# Patient Record
Sex: Male | Born: 1935 | Race: White | Hispanic: No | Marital: Married | Smoking: Former smoker
Health system: Southern US, Community
[De-identification: ages and names within clinical notes are randomized; demographics above are authoritative.]

## PROBLEM LIST (undated history)

## (undated) DIAGNOSIS — I4891 Unspecified atrial fibrillation: Secondary | ICD-10-CM

## (undated) DIAGNOSIS — I10 Essential (primary) hypertension: Secondary | ICD-10-CM

## (undated) DIAGNOSIS — H409 Unspecified glaucoma: Secondary | ICD-10-CM

## (undated) DIAGNOSIS — C9110 Chronic lymphocytic leukemia of B-cell type not having achieved remission: Secondary | ICD-10-CM

## (undated) DIAGNOSIS — I5030 Unspecified diastolic (congestive) heart failure: Secondary | ICD-10-CM

## (undated) DIAGNOSIS — D649 Anemia, unspecified: Secondary | ICD-10-CM

## (undated) DIAGNOSIS — N400 Enlarged prostate without lower urinary tract symptoms: Secondary | ICD-10-CM

## (undated) DIAGNOSIS — Z8619 Personal history of other infectious and parasitic diseases: Secondary | ICD-10-CM

## (undated) DIAGNOSIS — L299 Pruritus, unspecified: Secondary | ICD-10-CM

## (undated) DIAGNOSIS — K635 Polyp of colon: Secondary | ICD-10-CM

## (undated) DIAGNOSIS — K5730 Diverticulosis of large intestine without perforation or abscess without bleeding: Secondary | ICD-10-CM

## (undated) DIAGNOSIS — R3129 Other microscopic hematuria: Secondary | ICD-10-CM

## (undated) HISTORY — DX: Essential (primary) hypertension: I10

## (undated) HISTORY — DX: Benign prostatic hyperplasia without lower urinary tract symptoms: N40.0

## (undated) HISTORY — PX: CATARACT EXTRACTION, BILATERAL: SHX1313

## (undated) HISTORY — PX: HERNIA REPAIR: SHX51

## (undated) HISTORY — DX: Polyp of colon: K63.5

## (undated) HISTORY — DX: Diverticulosis of large intestine without perforation or abscess without bleeding: K57.30

## (undated) HISTORY — DX: Other microscopic hematuria: R31.29

## (undated) HISTORY — DX: Unspecified diastolic (congestive) heart failure: I50.30

## (undated) HISTORY — DX: Unspecified atrial fibrillation: I48.91

## (undated) HISTORY — DX: Personal history of other infectious and parasitic diseases: Z86.19

## (undated) HISTORY — PX: UMBILICAL HERNIA REPAIR: SHX196

## (undated) HISTORY — DX: Chronic lymphocytic leukemia of B-cell type not having achieved remission: C91.10

## (undated) HISTORY — DX: Unspecified glaucoma: H40.9

---

## 1987-01-25 DIAGNOSIS — N400 Enlarged prostate without lower urinary tract symptoms: Secondary | ICD-10-CM

## 1987-01-25 HISTORY — DX: Benign prostatic hyperplasia without lower urinary tract symptoms: N40.0

## 1987-01-25 HISTORY — PX: TRANSURETHRAL RESECTION OF PROSTATE: SHX73

## 1992-01-25 DIAGNOSIS — R3129 Other microscopic hematuria: Secondary | ICD-10-CM

## 1992-01-25 HISTORY — DX: Other microscopic hematuria: R31.29

## 2005-01-24 DIAGNOSIS — I4891 Unspecified atrial fibrillation: Secondary | ICD-10-CM

## 2005-01-24 HISTORY — DX: Unspecified atrial fibrillation: I48.91

## 2005-01-24 HISTORY — PX: CARDIAC CATHETERIZATION: SHX172

## 2005-01-24 HISTORY — PX: RADIOFREQUENCY ABLATION: SHX2290

## 2008-06-24 HISTORY — PX: COLONOSCOPY: SHX174

## 2011-11-02 LAB — LIPID PANEL
Cholesterol: 95 mg/dL (ref 0–200)
HDL: 58 mg/dL (ref 35–70)
Triglycerides: 44

## 2011-11-02 LAB — COMPREHENSIVE METABOLIC PANEL
AST: 18 U/L
Creat: 0.76
Glucose: 95

## 2012-01-15 ENCOUNTER — Encounter: Payer: Self-pay | Admitting: Family Medicine

## 2012-02-07 ENCOUNTER — Ambulatory Visit (INDEPENDENT_AMBULATORY_CARE_PROVIDER_SITE_OTHER): Payer: Medicare Other | Admitting: Family Medicine

## 2012-02-07 ENCOUNTER — Encounter: Payer: Self-pay | Admitting: Family Medicine

## 2012-02-07 VITALS — BP 128/66 | HR 88 | Temp 98.3°F | Ht 73.0 in | Wt 207.5 lb

## 2012-02-07 DIAGNOSIS — I10 Essential (primary) hypertension: Secondary | ICD-10-CM | POA: Insufficient documentation

## 2012-02-07 DIAGNOSIS — I4891 Unspecified atrial fibrillation: Secondary | ICD-10-CM | POA: Insufficient documentation

## 2012-02-07 DIAGNOSIS — M79671 Pain in right foot: Secondary | ICD-10-CM

## 2012-02-07 DIAGNOSIS — M25571 Pain in right ankle and joints of right foot: Secondary | ICD-10-CM

## 2012-02-07 DIAGNOSIS — Z86010 Personal history of colonic polyps: Secondary | ICD-10-CM

## 2012-02-07 DIAGNOSIS — M25579 Pain in unspecified ankle and joints of unspecified foot: Secondary | ICD-10-CM

## 2012-02-07 DIAGNOSIS — M79609 Pain in unspecified limb: Secondary | ICD-10-CM

## 2012-02-07 DIAGNOSIS — H409 Unspecified glaucoma: Secondary | ICD-10-CM | POA: Insufficient documentation

## 2012-02-07 NOTE — Assessment & Plan Note (Signed)
Refer to ophtho to get established per pt request.

## 2012-02-07 NOTE — Assessment & Plan Note (Signed)
Per pt due for rpt colonoscopy 2015 or 2016.

## 2012-02-07 NOTE — Assessment & Plan Note (Signed)
Chronic, stable. continue lisinopril.  Tolerating well.  Reviewed blood work he brings and asked to scan.

## 2012-02-07 NOTE — Patient Instructions (Addendum)
Pass by [name]'s office to set up appointment with eye doctor to get established. Return in October for medicare wellness visit or as needed. Watch knee and ankle. Elevate leg, ice/heat, tylenol or ibuprofen as needed, try OTC neoprene sleeve for knee. Let me know if you want to obtain ultrasound to evaluate for baker's cyst or if you want a referral for orthotics.

## 2012-02-07 NOTE — Assessment & Plan Note (Signed)
Anticipate baker's cyst vs referred pain from poor foot mechanics.  Less likely DJD. Discussed possible etiologies and option of ultrasound to eval for baker's cyst - pt declines currently. See pt instructions for plan.  Treat with brace, ice/heat, and OTC analgesics prn.

## 2012-02-07 NOTE — Assessment & Plan Note (Signed)
H/o this, resolved after ablation.

## 2012-02-07 NOTE — Progress Notes (Signed)
Subjective:    Patient ID: [name], male    DOB: 1932-01-27, 79 y.o.   MRN: [id number]  HPI CC: new pt to establish  Prior lived in [place] [facility] [place].  Moved late last year to area.  Prior PCP was Dr. Yetta Barre ([facility] in [facility] [place]). Recently lost 53 yo cat - distraught over this.  H/o atrial fibrillation s/p ablation 2007.  Intermittent flutter since that lasts seconds, had heart monitor this past fall, told overall normal, to monitor for now. HTN - controlled on lisinopril. BPH - s/p TURP.  Doing well off meds. Glaucoma - would like referral to ophtho to get established this year. Weight loss - 45 lb loss in last year.  Trying - significantly changed diet.  Also had been walking more frequently.  R knee and ankle pain - ankle pain going on for last year.  Tends to twist ball of foot when walking.  R knee pain and swelling - some soreness and fullness in back of knee and lateral knee discomfort.  No h/o knee surgeries.  Denies inciting trauma/injury.  Hasn't tried any meds for this.  No ice/heat either.  No locking of knee or knee giving out.  Some intermittent feelings of instability.  Preventative: Flu shot 09/2011 Shingles 2011 Pneumovax 2010 Tetanus 2012  Lives with wife.  Grown stepdaughter. Occupation: retired, was Acupuncturist Edu: Bachelor's Activity: no regular exercise.  taking online [employer] course. Diet: good water, fruits/vegetables daily.  Medications and allergies reviewed and updated in chart.  Past histories reviewed and updated if relevant as below. Patient Active Problem List  Diagnosis  . Atrial fibrillation  . Glaucoma  . Hypertension  . History of colon polyps   Past Medical History  Diagnosis Date  . Atrial fibrillation 2007    s/p ablation  . History of colon polyps   . Glaucoma   . Hypertension   . BPH (benign prostatic hypertrophy) 1989    s/p TURP   Past Surgical History  Procedure Date  . Hernia repair x6    inguinal,  ventral  . Cataract extraction, bilateral   . Umbilical hernia repair     strangulated  . Radiofrequency ablation 2007    Afib  . Transurethral resection of prostate 1989    BPH  . Colonoscopy 06/2008    no polyps, rec rpt 4-5 yrs (done in [place])   History  Substance Use Topics  . Smoking status: Former Smoker -- 18 years    Start date: 01/24/1993  . Smokeless tobacco: Never Used  . Alcohol Use: Yes     Comment: Regular (1 drink/day)   Family History  Problem Relation Age of Onset  . Adopted: Yes  . Cancer Father     lung, smoker  . Stroke Mother   . Diabetes Brother   . CAD Neg Hx    Allergies  Allergen Reactions  . Penicillins     Tested negative, but reacted negatively   Current Outpatient Prescriptions on File Prior to Visit  Medication Sig Dispense Refill  . lisinopril (PRINIVIL,ZESTRIL) 20 MG tablet Take 20 mg by mouth daily.      . rosuvastatin (CRESTOR) 10 MG tablet Take 10 mg by mouth daily.         Review of Systems  Constitutional: Negative for fever, chills, activity change, appetite change, fatigue and unexpected weight change.  HENT: Negative for hearing loss and neck pain.   Eyes: Negative for visual disturbance.  Respiratory: Positive for cough (am  cough). Negative for chest tightness, shortness of breath and wheezing.   Cardiovascular: Negative for chest pain, palpitations and leg swelling.  Gastrointestinal: Negative for nausea, vomiting, abdominal pain, diarrhea, constipation, blood in stool and abdominal distention.  Genitourinary: Negative for hematuria and difficulty urinating.  Musculoskeletal: Negative for myalgias and arthralgias.  Skin: Negative for rash.  Neurological: Negative for dizziness, seizures, syncope and headaches.  Hematological: Does not bruise/bleed easily.  Psychiatric/Behavioral: Negative for dysphoric mood. The patient is not nervous/anxious.        Objective:   Physical Exam  Nursing note and vitals  reviewed. Constitutional: He is oriented to person, place, and time. He appears well-developed and well-nourished. No distress.  HENT:  Head: Normocephalic and atraumatic.  Right Ear: Hearing, tympanic membrane, external ear and ear canal normal.  Left Ear: Hearing, tympanic membrane, external ear and ear canal normal.  Nose: Nose normal.  Mouth/Throat: Oropharynx is clear and moist. No oropharyngeal exudate.  Eyes: Conjunctivae normal and EOM are normal. Pupils are equal, round, and reactive to light. No scleral icterus.  Neck: Normal range of motion. Neck supple. No thyromegaly present.  Cardiovascular: Normal rate, regular rhythm, normal heart sounds and intact distal pulses.   No murmur heard. Pulses:      Radial pulses are 2+ on the right side, and 2+ on the left side.  Pulmonary/Chest: Effort normal and breath sounds normal. No respiratory distress. He has no wheezes. He has no rales.  Abdominal: Soft. Bowel sounds are normal. He exhibits no distension and no mass. There is no tenderness. There is no rebound and no guarding.  Musculoskeletal: Normal range of motion.       Right knee: He exhibits swelling. He exhibits normal range of motion and no erythema. no tenderness found.       Left knee: Normal.       Right ankle: He exhibits normal range of motion and no swelling. no tenderness. Achilles tendon normal.       Left ankle: Normal.       R knee - FROM.  neg drawer test.  Neg mcmurray's, no PF grind.  No abnormal patellar mobility. Some fullness to palpation of popliteal area but no tenderness. No lateral joint line tenderness.  R ankle - FROM.  No malleolus tenderness.  No lateral ligament tenderness.  No pain at metatarsals. Pes planus with lateral displacement of toes on stance.  Lymphadenopathy:    He has no cervical adenopathy.  Neurological: He is alert and oriented to person, place, and time.       CN grossly intact, station and gait intact  Skin: Skin is warm and dry.  No rash noted.  Psychiatric: He has a normal mood and affect. His behavior is normal. Judgment and thought content normal.       Assessment & Plan:

## 2012-02-07 NOTE — Assessment & Plan Note (Signed)
Longstanding - anticipate due to poor foot mechanics with pes planus. Offered referral for custom orthotics - pt prefers to monitor for now, will let me know if decides to pursue further workup/treatment.

## 2012-02-15 ENCOUNTER — Encounter: Payer: Self-pay | Admitting: Family Medicine

## 2012-03-12 ENCOUNTER — Ambulatory Visit (INDEPENDENT_AMBULATORY_CARE_PROVIDER_SITE_OTHER): Payer: Medicare Other | Admitting: Family Medicine

## 2012-03-12 ENCOUNTER — Encounter: Payer: Self-pay | Admitting: Family Medicine

## 2012-03-12 VITALS — BP 128/66 | HR 66 | Temp 98.1°F | Wt 206.0 lb

## 2012-03-12 DIAGNOSIS — J208 Acute bronchitis due to other specified organisms: Secondary | ICD-10-CM | POA: Insufficient documentation

## 2012-03-12 DIAGNOSIS — J209 Acute bronchitis, unspecified: Secondary | ICD-10-CM

## 2012-03-12 MED ORDER — GUAIFENESIN-CODEINE 100-10 MG/5ML PO SYRP: 5.0000 mL | ORAL_SOLUTION | Freq: Every evening | ORAL | Status: DC | PRN

## 2012-03-12 NOTE — Assessment & Plan Note (Signed)
As slowly resolving on its own and given sxs endorsed, anticipate viral bronchitis - see pt instructions for plan. Discussed in detail reasons to be concerned for bacterial infection. cheratussin for cough.  States has tolerated codeine well in past. Pt agrees with plan

## 2012-03-12 NOTE — Patient Instructions (Signed)
Sounds like you have a viral upper respiratory infection and bronchitis. Antibiotics are not needed for this.  Viral infections usually take 7-10 days to resolve.  The cough can last a few weeks to go away. Use medication as prescribed: cheratussin for cough at night Push fluids and plenty of rest. Let me know if not improving as expected - fever >101, sudden worsening after initial improvement, worsening productive cough - for antibiotic course. Call clinic with questions.  Good to see you today.

## 2012-03-12 NOTE — Progress Notes (Signed)
  Subjective:    Patient ID: [name], male    DOB: 12-06-1932, 79 y.o.   MRN: [id number]  HPI CC: cough  10d h/o terrible cough.  When rolls in bed goes into cough spasm.  Fatigued.  Felt R lump in neck a few days ago, seems to be getting smaller.  Rhinorrhea, watery eyes.  Overall dry cough.  Some diarrhea recently, now back to normal.    So far has tried cough medicine OTC, singulair.  Tried holding travatan  No fevers/chills, abd pain, n/v, ear or tooth pain, ST, PNDrainage, HA.  No sick contacts at home. Did receive flu shot this fall. No h/o asthma, COPD. Not around smokers.  Scheduled ophtho march 3rd  Past Medical History  Diagnosis Date  . Atrial fibrillation 2007    s/p ablation  . History of colon polyps   . Glaucoma   . Hypertension   . BPH (benign prostatic hypertrophy) 1989    s/p TURP  . History of chicken pox      Review of Systems Per HPI    Objective:   Physical Exam  Nursing note and vitals reviewed. Constitutional: He appears well-developed and well-nourished. No distress.  HENT:  Head: Normocephalic and atraumatic.  Right Ear: Hearing, tympanic membrane, external ear and ear canal normal.  Left Ear: Hearing, tympanic membrane, external ear and ear canal normal.  Nose: Mucosal edema present. No rhinorrhea. Right sinus exhibits no maxillary sinus tenderness and no frontal sinus tenderness. Left sinus exhibits no maxillary sinus tenderness and no frontal sinus tenderness.  Mouth/Throat: Uvula is midline and mucous membranes are normal. Posterior oropharyngeal erythema (mild) present. No oropharyngeal exudate, posterior oropharyngeal edema or tonsillar abscesses.  Eyes: Conjunctivae and EOM are normal. Pupils are equal, round, and reactive to light. No scleral icterus.  Neck: Normal range of motion. Neck supple.  Cardiovascular: Normal rate, regular rhythm, normal heart sounds and intact distal pulses.   No murmur heard. Pulmonary/Chest: Effort  normal and breath sounds normal. No respiratory distress. He has no wheezes. He has no rales.  Lungs clear  Lymphadenopathy:    He has cervical adenopathy (R AC LAD).  Skin: Skin is warm and dry. No rash noted.       Assessment & Plan:

## 2012-04-24 ENCOUNTER — Emergency Department: Payer: Self-pay | Admitting: Emergency Medicine

## 2012-04-24 ENCOUNTER — Ambulatory Visit: Payer: Medicare Other | Admitting: Family Medicine

## 2012-04-24 DIAGNOSIS — J449 Chronic obstructive pulmonary disease, unspecified: Secondary | ICD-10-CM | POA: Insufficient documentation

## 2012-04-24 DIAGNOSIS — I5030 Unspecified diastolic (congestive) heart failure: Secondary | ICD-10-CM

## 2012-04-24 HISTORY — PX: OTHER SURGICAL HISTORY: SHX169

## 2012-04-24 HISTORY — PX: US ECHOCARDIOGRAPHY: HXRAD669

## 2012-04-24 HISTORY — DX: Unspecified diastolic (congestive) heart failure: I50.30

## 2012-04-24 LAB — URINALYSIS, COMPLETE
Bacteria: NONE SEEN
Bilirubin,UR: NEGATIVE
Glucose,UR: NEGATIVE mg/dL (ref 0–75)
Leukocyte Esterase: NEGATIVE
Nitrite: NEGATIVE
Ph: 7 (ref 4.5–8.0)
RBC,UR: 22 /HPF (ref 0–5)
Specific Gravity: 1.02 (ref 1.003–1.030)

## 2012-04-24 LAB — COMPREHENSIVE METABOLIC PANEL
Albumin: 2.9
Albumin: 2.9 g/dL — ABNORMAL LOW (ref 3.4–5.0)
Alkaline Phosphatase: 91 U/L
Chloride: 103 mmol/L (ref 98–107)
Creatinine: 0.74 mg/dL (ref 0.60–1.30)
EGFR (African American): >60
EGFR (Non-African Amer.): >60
Glucose: 95 mg/dL (ref 65–99)
Potassium: 4.1 mmol/L (ref 3.5–5.1)

## 2012-04-24 LAB — CBC
HCT: 34.9 % — ABNORMAL LOW (ref 40.0–52.0)
Hemoglobin: 11.4 g/dL — AB (ref 13.5–17.5)
MCH: 27.3 pg (ref 26.0–34.0)
MCHC: 32.7 g/dL (ref 32.0–36.0)
MCV: 84 fL
MCV: 84 fL (ref 80–100)
Platelet: 431 10*3/uL (ref 150–440)
RBC: 4.18 10*6/uL — ABNORMAL LOW (ref 4.40–5.90)
RDW: 16.5
RDW: 16.5 % — ABNORMAL HIGH (ref 11.5–14.5)
WBC: 10.8
WBC: 10.8 10*3/uL — ABNORMAL HIGH (ref 3.8–10.6)
platelet count: 431

## 2012-05-01 ENCOUNTER — Encounter: Payer: Self-pay | Admitting: Family Medicine

## 2012-05-01 ENCOUNTER — Telehealth: Payer: Self-pay | Admitting: Family Medicine

## 2012-05-01 ENCOUNTER — Ambulatory Visit (INDEPENDENT_AMBULATORY_CARE_PROVIDER_SITE_OTHER): Payer: Medicare Other | Admitting: Family Medicine

## 2012-05-01 VITALS — BP 142/70 | HR 72 | Temp 98.0°F | Ht 71.5 in | Wt 198.5 lb

## 2012-05-01 DIAGNOSIS — R55 Syncope and collapse: Secondary | ICD-10-CM

## 2012-05-01 DIAGNOSIS — I4891 Unspecified atrial fibrillation: Secondary | ICD-10-CM

## 2012-05-01 DIAGNOSIS — I10 Essential (primary) hypertension: Secondary | ICD-10-CM

## 2012-05-01 DIAGNOSIS — J449 Chronic obstructive pulmonary disease, unspecified: Secondary | ICD-10-CM

## 2012-05-01 DIAGNOSIS — R3129 Other microscopic hematuria: Secondary | ICD-10-CM

## 2012-05-01 DIAGNOSIS — D649 Anemia, unspecified: Secondary | ICD-10-CM

## 2012-05-01 DIAGNOSIS — N400 Enlarged prostate without lower urinary tract symptoms: Secondary | ICD-10-CM | POA: Insufficient documentation

## 2012-05-01 LAB — CBC WITH DIFFERENTIAL/PLATELET
Basophils Absolute: 0.1 10*3/uL (ref 0.0–0.1)
Eosinophils Absolute: 0.1 10*3/uL (ref 0.0–0.7)
Eosinophils Relative: 0.6 % (ref 0.0–5.0)
MCV: 83.1 fl (ref 78.0–100.0)
Monocytes Absolute: 1.9 10*3/uL — ABNORMAL HIGH (ref 0.1–1.0)
Neutrophils Relative %: 68.7 % (ref 43.0–77.0)
Platelets: 467 10*3/uL — ABNORMAL HIGH (ref 150.0–400.0)
RDW: 16.9 % — ABNORMAL HIGH (ref 11.5–14.6)
WBC: 10.6 10*3/uL — ABNORMAL HIGH (ref 4.5–10.5)

## 2012-05-01 LAB — IBC PANEL
Iron: 17 ug/dL — ABNORMAL LOW (ref 42–165)
Saturation Ratios: 8.1 % — ABNORMAL LOW (ref 20.0–50.0)
Transferrin: 149.4 mg/dL — ABNORMAL LOW (ref 212.0–360.0)

## 2012-05-01 LAB — VITAMIN B12: Vitamin B-12: 203 pg/mL — ABNORMAL LOW (ref 211–911)

## 2012-05-01 NOTE — Assessment & Plan Note (Signed)
Borderline elevated today - pt stopped ACEI due to concern with side effects. I asked him to monitor BP at home and notify me if consistently elevated - consider starting ARB vs diuretic. Await echo.

## 2012-05-01 NOTE — Assessment & Plan Note (Signed)
Found at ER - will repeat today along with iron panel and vit B12/folate. Longterm h/o alcohol use - recommended start multivitamin daily with biotin for nails.

## 2012-05-01 NOTE — Patient Instructions (Addendum)
Start checking blood pressure at home - if consistently >140/90, call me to start another blood pressure medicine. Stop lisinopril.  Restart crestor and see how itching and other symptoms do. Blood work today. Pass by Marion's office for referral for ultrasound of heart and carotid arteries. Good to see you today, call us with questions. Return to see me in 2-3 months for follow up.  Sooner if any more falls or passing out

## 2012-05-01 NOTE — Assessment & Plan Note (Addendum)
Pt suffered an episode of what sounds like syncope Head CT in hospital with generalized atrophy but no acute finding.  No current sxs concerning for subdural. Will further pursue workup with carotid US (given R inferior bruit heard today) as well as echocardiogram. I have also requested records of latest workup by his prior cardiologist (Dr. Bascom Levels).  CXR from hospital with possible CM and small bilateral pleural effusions, mild JVD and pedal edema today, concern for CHF - await echo.

## 2012-05-01 NOTE — Telephone Encounter (Signed)
[name], CMA student, charting under Tracia Lacomb, RN 

## 2012-05-01 NOTE — Assessment & Plan Note (Signed)
Stable, sounds regular today

## 2012-05-01 NOTE — Addendum Note (Signed)
Addended by: Baldomero Lamy on: 05/01/2012 02:02 PM   Modules accepted: Orders

## 2012-05-01 NOTE — Assessment & Plan Note (Signed)
Chronic, persistent.  No records of w/u but per pt normal in past.

## 2012-05-01 NOTE — Progress Notes (Signed)
Subjective:    Patient ID: [name], male    DOB: September 07, 1932, 79 y.o.   MRN: [id number]  HPI CC: ER f/u  Seen at ER 04/24/2012 after fall with injury to posterior head.  Occurred in middle of night on trip to bathroom.  Possible LOC.  States turned light on and then next thing he remembers is waking up on floor.  Wife did not hear fall.  No seizure activity, confusion or post ictal period.  Doesn't remember falling.  Denies prodrome like dizziness, lightheadedness.  Denies orthostatic lightheadedness.  Suffered L scalp abrasion, did not need staples.  [facility] ER records reviewed and asked to scan as appropriate. EKG - NSR, LAD, poor R wave progression ?lead placement, ST change lateral leads - asked to scan. CMP, CBC, CE x1 reviewed. Anemia with Hgb 10.8, MCV 84. Hematuria found with 22 RBC/hpf - per pt chronic.Marland Kitchen Head CT w/o contrast - no acute intracranial process.  Generalized cerebral atrophy. CXR - COPD, borderline CM, small bilat effusions.  Has lost 50 lbs in last year.  Not unexpected per pt, trying to eat healthier. States was wearing heavier clothes last visit weight.  Wt Readings from Last 3 Encounters:  05/01/12 198 lb 8 oz (90.039 kg)  03/12/12 206 lb (93.441 kg)  02/07/12 207 lb 8 oz (94.121 kg)   Colonoscopy 06/2008 - normal.  H/o colon polyps so rec rpt 4-5 yrs. COPD by CXR - pt denies dyspnea, wheezing. h/o afib s/p ablation.  Was on coumadin while in afib.  States last echo was 7+ yrs ago.  States had event monitor 09/2011, WNL. Denies hx CHF. No records from prior cardiologist available - have requested today.  2 glasses of wine/day.  Had been on ACEI for 7 years.  Over last few weeks had developed severe diffuse pruritis that led to excoriations.  Stopped crestor and lisinopril over the weekend - feels less itchy since stopping this.  Reviewed lisinopril side effects, thinks med was causing this - itching, tingling and burning, indigestion, dry cough.  Does have wrist  cuff at home.  Has not been checking bp off lisinopril.  Attributes cough to scented candle soot he had at home (see prior note for details).  Soot would shut AC/heater down due to obstruction.  Stopped this and cough/phlegm has decreased.  Past Medical History  Diagnosis Date  . Atrial fibrillation 2007    s/p ablation  . History of colon polyps   . Glaucoma   . Hypertension   . BPH (benign prostatic hypertrophy) 1989    s/p TURP  . History of chicken pox   . Microhematuria 1994    longstanding per pt, states normal w/u in past (has had cystoscopy, CT)   Past Surgical History  Procedure Laterality Date  . Hernia repair  x6    inguinal, ventral  . Cataract extraction, bilateral    . Umbilical hernia repair      strangulated  . Radiofrequency ablation  2007    Afib  . Transurethral resection of prostate  1989    BPH  . Colonoscopy  06/2008    no polyps, rec rpt 4-5 yrs (done in [place])   Review of Systems Per HPI    Objective:   Physical Exam  Nursing note and vitals reviewed. Constitutional: He appears well-developed and well-nourished. No distress.  HENT:  Head: Normocephalic. Head is with abrasion (L poterior skull).    Mouth/Throat: Oropharynx is clear and moist. No oropharyngeal exudate.  Eyes: Conjunctivae and EOM are normal. Pupils are equal, round, and reactive to light. No scleral icterus.  Neck: Normal range of motion. Neck supple. JVD (mild) present. Carotid bruit is present (R sided).  Cardiovascular: Normal rate, regular rhythm and intact distal pulses.   Murmur heard. Pulmonary/Chest: Effort normal and breath sounds normal. No respiratory distress. He has no wheezes. He has no rales.  Lungs clear  Abdominal: Soft. Bowel sounds are normal. He exhibits no distension and no mass. There is no tenderness. There is no rebound and no guarding.  Musculoskeletal: He exhibits edema (1+ pitting edema).  Lymphadenopathy:    He has no cervical adenopathy.  Skin:  Skin is warm and dry.  Excoriations lower back Brittle splitting nails (longitudinal grooves with onychorrhexis)  Psychiatric: He has a normal mood and affect.       Assessment & Plan:

## 2012-05-01 NOTE — Assessment & Plan Note (Signed)
Denies dyspnea, wheeze.  Continue to monitor. Consider spirometry in future.

## 2012-05-03 ENCOUNTER — Other Ambulatory Visit (INDEPENDENT_AMBULATORY_CARE_PROVIDER_SITE_OTHER): Payer: Medicare Other

## 2012-05-03 ENCOUNTER — Encounter: Payer: Self-pay | Admitting: Family Medicine

## 2012-05-03 ENCOUNTER — Other Ambulatory Visit: Payer: Self-pay | Admitting: Family Medicine

## 2012-05-03 ENCOUNTER — Encounter: Payer: Self-pay | Admitting: *Deleted

## 2012-05-03 ENCOUNTER — Other Ambulatory Visit: Payer: Self-pay

## 2012-05-03 DIAGNOSIS — R55 Syncope and collapse: Secondary | ICD-10-CM

## 2012-05-03 MED ORDER — FERROUS SULFATE 325 (65 FE) MG PO TABS: 325.0000 mg | ORAL_TABLET | Freq: Every day | ORAL | Status: DC

## 2012-05-03 MED ORDER — CYANOCOBALAMIN 1000 MCG PO TABS: 1000.0000 ug | ORAL_TABLET | Freq: Every day | ORAL | Status: DC

## 2012-05-04 ENCOUNTER — Encounter: Payer: Self-pay | Admitting: Family Medicine

## 2012-05-04 ENCOUNTER — Other Ambulatory Visit (HOSPITAL_COMMUNITY): Payer: Medicare Other

## 2012-05-16 ENCOUNTER — Encounter (INDEPENDENT_AMBULATORY_CARE_PROVIDER_SITE_OTHER): Payer: Medicare Other

## 2012-05-16 DIAGNOSIS — I6529 Occlusion and stenosis of unspecified carotid artery: Secondary | ICD-10-CM

## 2012-05-16 DIAGNOSIS — R55 Syncope and collapse: Secondary | ICD-10-CM

## 2012-05-17 ENCOUNTER — Encounter: Payer: Self-pay | Admitting: Family Medicine

## 2012-05-17 ENCOUNTER — Encounter: Payer: Self-pay | Admitting: *Deleted

## 2012-05-17 ENCOUNTER — Ambulatory Visit (INDEPENDENT_AMBULATORY_CARE_PROVIDER_SITE_OTHER): Payer: Medicare Other | Admitting: Family Medicine

## 2012-05-17 ENCOUNTER — Encounter: Payer: Self-pay | Admitting: Gastroenterology

## 2012-05-17 VITALS — BP 138/68 | HR 76 | Temp 97.8°F | Ht 71.5 in | Wt 196.5 lb

## 2012-05-17 DIAGNOSIS — Z86010 Personal history of colonic polyps: Secondary | ICD-10-CM

## 2012-05-17 DIAGNOSIS — R21 Rash and other nonspecific skin eruption: Secondary | ICD-10-CM

## 2012-05-17 DIAGNOSIS — D509 Iron deficiency anemia, unspecified: Secondary | ICD-10-CM

## 2012-05-17 MED ORDER — PERMETHRIN 5 % EX CREA: TOPICAL_CREAM | Freq: Once | CUTANEOUS | Status: DC

## 2012-05-17 NOTE — Patient Instructions (Addendum)
Pass by Marion's office for referral for colonoscopy. I wonder if this itching is due to bug bites - treat skin with permethrin cream (spare head).  Look at mattress and buy plastic covering. May use oral benadryl 25mg  to help with itch, as well as oatmeal bath.  Look at sarna cream as anti itch as well. Good to see you today, give me an update with how itching is doing.  Bedbugs Bedbugs are tiny bugs that live in and around beds. During the day, they hide in mattresses and other places near beds. They come out at night and bite people lying in bed. They need blood to live and grow. Bedbugs can be found in beds anywhere. Usually, they are found in places where many people come and go (hotels, shelters, hospitals). It does not matter whether the place is dirty or clean. Getting bitten by bedbugs rarely causes a medical problem. The biggest problem can be getting rid of them. This often takes the work of a Oncologist. CAUSES  Less use of pesticides. Bedbugs were common before the 1950s. Then, strong pesticides such as DDT nearly wiped them out. Today, these pesticides are not used because they harm the environment and can cause health problems.  More travel. Besides mattresses, bedbugs can also live in clothing and luggage. They can come along as people travel from place to place. Bedbugs are more common in certain parts of the world. When people travel to those areas, the bugs can come home with them.  Presence of birds and bats. Bedbugs often infest birds and bats. If you have these animals in or near your home, bedbugs may infest your house, too. SYMPTOMS It does not hurt to be bitten by a bedbug. You will probably not wake up when you are bitten. Bedbugs usually bite areas of the skin that are not covered. Symptoms may show when you wake up, or they may take a day or more to show up. Symptoms may include:  Small red bumps on the skin. These might be lined up in a row or clustered in a  group.  A darker red dot in the middle of red bumps.  Blisters on the skin. There may be swelling and very bad itching. These may be signs of an allergic reaction. This does not happen often. DIAGNOSIS Bedbug bites might look and feel like other types of insect bites. The bugs do not stay on the body like ticks or lice. They bite, drop off, and crawl away to hide. Your caregiver will probably:  Ask about your symptoms.  Ask about your recent activities and travel.  Check your skin for bedbug bites.  Ask you to check at home for signs of bedbugs. You should look for:  Spots or stains on the bed or nearby. This could be from bedbugs that were crushed or from their eggs or waste.  Bedbugs themselves. They are reddish-brown, oval, and flat. They do not fly. They are about the size of an apple seed.  Places to look for bedbugs include:  Beds. Check mattresses, headboards, box springs, and bed frames.  On drapes and curtains near the bed.  Under carpeting in the bedroom.  Behind electrical outlets.  Behind any wallpaper that is peeling.  Inside luggage. TREATMENT Most bedbug bites do not need treatment. They usually go away on their own in a few days. The bites are not dangerous. However, treatment may be needed if you have scratched so much that your skin  has become infected. You may also need treatment if you are allergic to bedbug bites. Treatment options include:  A drug that stops swelling and itching (corticosteroid). Usually, a cream is rubbed on the skin. If you have a bad rash, you may be given a corticosteroid pill.  Oral antihistamines. These are pills to help control itching.  Antibiotic medicines. An antibiotic may be prescribed for infected skin. HOME CARE INSTRUCTIONS   Take any medicine prescribed by your caregiver for your bites. Follow the directions carefully.  Consider wearing pajamas with long sleeves and pant legs.  Your bedroom may need to be treated. A  pest control expert should make sure the bedbugs are gone. You may need to throw away mattresses or luggage. Ask the pest control expert what you can do to keep the bedbugs from coming back. Common suggestions include:  Putting a plastic cover over your mattress.  Washing and drying your clothes and bedding in hot water and a hot dryer. The temperature should be hotter than 120 F (48.9 C). Bedbugs are killed by high temperatures.  Vacuuming carefully all around your bed. Vacuum in all cracks and crevices where the bugs might hide. Do this often.  Carefully checking all used furniture, bedding, or clothes that you bring into your house.  Eliminating bird nests and bat roosts.  If you get bedbug bites when traveling, check all your possessions carefully before bringing them into your house. If you find any bugs on clothes or in your luggage, consider throwing those items away. SEEK MEDICAL CARE IF:  You have red bug bites that keep coming back.  You have red bug bites that itch badly.  You have bug bites that cause a skin rash.  You have scratch marks that are red and sore. SEEK IMMEDIATE MEDICAL CARE IF: You have a fever. Document Released: 02/12/2010 Document Revised: 04/04/2011 Document Reviewed: 02/12/2010 [facility] Patient Information 2013 [place], [place].

## 2012-05-17 NOTE — Progress Notes (Addendum)
  Subjective:    Patient ID: [name], male    DOB: 1932/07/27, 79 y.o.   MRN: [id number]  HPI CC: f/u itchy skin.  Itchy skin thought last visit due to lisinopril as improved with holding this medication.  However off lisinopril, off crestor.  Dark loose stools, staying cold.  Low energy.  Iron started this month - correlates with darker stools.  Wife not itching at home.  Sleeps in separate beds. No recent travel. Using wife's body lotion. Denies new lotions, detergents, soaps, shampoos. Lab Results  Component Value Date   TSH 1.61 05/01/2012    Colonoscopy 06/2008 in [place] WNL, but rec rpt 4-5 yrs 2/2 h/o colon polyps.  Past Medical History  Diagnosis Date  . Atrial fibrillation 2007    s/p ablation  . History of colon polyps   . Glaucoma   . Hypertension   . BPH (benign prostatic hypertrophy) 1989    s/p TURP  . History of chicken pox   . Microhematuria 1994    longstanding per pt, states normal w/u in past (has had cystoscopy, CT)  . COPD (chronic obstructive pulmonary disease) 04/2012    by CXR, mild pulm HTN by echo  . Diastolic CHF 04/2012    grade 1    Review of Systems Per HPI    Objective:   Physical Exam NAD Diffuse papular erythematous pruritic patches with excoriations spread throughout upper back, shoulders, lower back, abdomen, and upper thighs Spares arms, legs.    Assessment & Plan:

## 2012-05-17 NOTE — Assessment & Plan Note (Signed)
IDA noted.  Started iron tablets last few weeks.   Due for colonoscopy - recommended hold iron until colonoscopy then will restart. Referred for colonoscopy today.

## 2012-05-17 NOTE — Assessment & Plan Note (Signed)
Persistent despite holding ACEI and statin for last 3 weeks. On evaluation, newer rash on abdomen shows papular lesions, ?bug bites scabies vs bed bugs Will treat with permethrin, benadryl, sarna and oatmeal bath, see pt instructions for bed bug plan. Update if sxs persist for further evaluation.

## 2012-05-18 ENCOUNTER — Encounter: Payer: Self-pay | Admitting: Gastroenterology

## 2012-06-07 ENCOUNTER — Ambulatory Visit (INDEPENDENT_AMBULATORY_CARE_PROVIDER_SITE_OTHER): Payer: Medicare Other | Admitting: Gastroenterology

## 2012-06-07 ENCOUNTER — Encounter: Payer: Self-pay | Admitting: Gastroenterology

## 2012-06-07 VITALS — BP 140/70 | HR 86 | Ht 71.46 in | Wt 197.4 lb

## 2012-06-07 DIAGNOSIS — Z86010 Personal history of colonic polyps: Secondary | ICD-10-CM

## 2012-06-07 DIAGNOSIS — E538 Deficiency of other specified B group vitamins: Secondary | ICD-10-CM

## 2012-06-07 DIAGNOSIS — D509 Iron deficiency anemia, unspecified: Secondary | ICD-10-CM

## 2012-06-07 MED ORDER — MOVIPREP 100 G PO SOLR: 1.0000 | Freq: Once | ORAL | Status: DC

## 2012-06-07 NOTE — Patient Instructions (Signed)

## 2012-06-07 NOTE — Progress Notes (Signed)
History of Present Illness:  This is a very pleasant 79 year old Caucasian male with new diagnosed iron and B12 deficiency.  This patient has a history of colon polyps going back many years with last colonoscopy in 2010.  He has some flatulence, burping, but denies true reflux symptoms, melena or hematochezia.  Recent labs showed a hemoglobin of 10.6, B12- 203, and a 8 percent iron saturation.  His voluntarily lost 50 pounds in weight over the last year with [employer], but denies any specific food intolerances, dysphagia, or abnormal bowel pattern.  He has a past history of atrial fibrillation with ablation at  [facility] in 2007.  Also in the past he had emergent repair of a large umbilical hernia in 1990.  Has a past history of mild COPD, but currently denies any cardiopulmonary symptoms and walks 2 miles per day.  He denies cough, sputum production or hemoptysis.  He is on no medications as safe aspirin 81 mg a day and B12 1000 mcg a day.  He feels well and denies systemic complaints.  Family history is noncontributory.  I have reviewed this patient's present history, medical and surgical past history, allergies and medications.     ROS:   All systems were reviewed and are negative unless otherwise stated in the HPI.    Physical Exam: Blood pressure and 40/70, pulse 86 and regular, and weight 197 with 98% oxygen saturation on room air.  BMI 27.18. General well developed well nourished patient in no acute distress, appearing their stated age Eyes PERRLA, no icterus, fundoscopic exam per opthamologist Skin no lesions noted Neck supple, no adenopathy, no thyroid enlargement, no tenderness Chest clear to percussion and auscultation... no wheezes or rhonchi noted. Heart no significant murmurs, gallops or rubs noted Abdomen no hepatosplenomegaly masses or tenderness, BS normal.  Large midline abdominal scar noted without evidence of incisional hernia. Rectal inspection normal no fissures, or fistulae  noted.  No masses or tenderness on digital exam. Stool guaiac negative. Extremities no acute joint lesions, edema, phlebitis or evidence of cellulitis. Neurologic patient oriented x 3, cranial nerves intact, no focal neurologic deficits noted. Psychological mental status normal and normal affect.  Assessment and plan: New-onset iron deficiency and a 80 year old Caucasian male with a past history of colon polyps, rule out recurrent polyposis versus upper GI source of occult blood loss.  His B12 deficiency may need nutritional with his very unusual diet.  He certainly appears to be in good cardiovascular and pulmonary condition at this time.  Should his endoscopies be been unremarkable, we will consider pill camera exam of his gut.  At the time of endoscopy we'll check him for H. pylori and obtain duodenal biopsy to exclude celiac disease although this is unlikely.  Other considerations would be chronic GI blood loss from a daily aspirin therapy.  Stool exam today was occult blood negative.  Encounter Diagnoses  Name Primary?  Marland Kitchen Hx of colonic polyps Yes  . Iron deficiency anemia, unspecified

## 2012-06-11 ENCOUNTER — Ambulatory Visit: Payer: Medicare Other | Admitting: Gastroenterology

## 2012-06-24 DIAGNOSIS — K635 Polyp of colon: Secondary | ICD-10-CM

## 2012-06-24 HISTORY — PX: ESOPHAGOGASTRODUODENOSCOPY: SHX1529

## 2012-06-24 HISTORY — PX: COLONOSCOPY: SHX174

## 2012-06-24 HISTORY — DX: Polyp of colon: K63.5

## 2012-06-27 ENCOUNTER — Ambulatory Visit (AMBULATORY_SURGERY_CENTER): Payer: Medicare Other | Admitting: Gastroenterology

## 2012-06-27 ENCOUNTER — Encounter: Payer: Self-pay | Admitting: Gastroenterology

## 2012-06-27 VITALS — BP 130/61 | HR 62 | Temp 98.3°F | Resp 16 | Ht 71.0 in | Wt 197.0 lb

## 2012-06-27 DIAGNOSIS — E538 Deficiency of other specified B group vitamins: Secondary | ICD-10-CM

## 2012-06-27 DIAGNOSIS — D1330 Benign neoplasm of unspecified part of small intestine: Secondary | ICD-10-CM

## 2012-06-27 DIAGNOSIS — K5730 Diverticulosis of large intestine without perforation or abscess without bleeding: Secondary | ICD-10-CM

## 2012-06-27 DIAGNOSIS — D126 Benign neoplasm of colon, unspecified: Secondary | ICD-10-CM

## 2012-06-27 DIAGNOSIS — D649 Anemia, unspecified: Secondary | ICD-10-CM

## 2012-06-27 DIAGNOSIS — Z86010 Personal history of colonic polyps: Secondary | ICD-10-CM

## 2012-06-27 DIAGNOSIS — D509 Iron deficiency anemia, unspecified: Secondary | ICD-10-CM

## 2012-06-27 MED ORDER — SODIUM CHLORIDE 0.9 % IV SOLN: 500.0000 mL | INTRAVENOUS | Status: DC

## 2012-06-27 NOTE — Op Note (Signed)
[facility] [address] [place], [address]   ENDOSCOPY PROCEDURE REPORT  PATIENT: [name], [name]  MR#: [id number] BIRTHDATE: 1932/03/07 , 80  yrs. old GENDER: Male ENDOSCOPIST:Mcguire Gasparyan Hale Bogus, MD, [facility] REFERRED BY: Eustaquio Boyden, M.D. PROCEDURE DATE:  06/27/2012 PROCEDURE:   EGD w/ biopsy and EGD w/ biopsy for H.pylori ASA CLASS:    Class III INDICATIONS: Iron deficiency anemia. MEDICATION: There was residual sedation effect present from prior procedure and propofol (Diprivan) 100mg  IV TOPICAL ANESTHETIC:  DESCRIPTION OF PROCEDURE:   After the risks and benefits of the procedure were explained, informed consent was obtained.  The LB [id number]  endoscope was introduced through the mouth  and advanced to the second portion of the duodenum .  The instrument was slowly withdrawn as the mucosa was fully examined.      DUODENUM: A medium sized diverticulum was found in the 2nd part of the duodenum.   The duodenal mucosa showed no abnormalities in the bulb and second portion of the duodenum.  Cold forcep biopsies were taken in the second portion.  STOMACH: The mucosa of the stomach appeared normal.  A CLO biopsy was performed.  ESOPHAGUS: The mucosa of the esophagus appeared normal.          The scope was then withdrawn from the patient and the procedure completed.  COMPLICATIONS: There were no complications.   ENDOSCOPIC IMPRESSION: 1.   Diverticulum was found in the 2nd part of the duodenum 2.   The duodenal mucosa showed no abnormalities in the bulb and second portion of the duodenum ..r/o celiac disease 3.   The mucosa of the stomach appeared normal; biopsy ..r/o H.pylori 4.   The mucosa of the esophagus appeared normal  RECOMMENDATIONS: 1.  Await pathology results 2.  Continue current medications    _______________________________ eSigned:  Mardella Layman, MD, [facility] 06/27/2012 2:54 PM

## 2012-06-27 NOTE — Patient Instructions (Addendum)
HOLD YOUR ASPIRIN FOR ONE WEEK.   YOU HAD AN ENDOSCOPIC PROCEDURE TODAY AT THE [facility]: Refer to the procedure report that was given to you for any specific questions about what was found during the examination.  If the procedure report does not answer your questions, please call your gastroenterologist to clarify.  If you requested that your care partner not be given the details of your procedure findings, then the procedure report has been included in a sealed envelope for you to review at your convenience later.  YOU SHOULD EXPECT: Some feelings of bloating in the abdomen. Passage of more gas than usual.  Walking can help get rid of the air that was put into your GI tract during the procedure and reduce the bloating. If you had a lower endoscopy (such as a colonoscopy or flexible sigmoidoscopy) you may notice spotting of blood in your stool or on the toilet paper. If you underwent a bowel prep for your procedure, then you may not have a normal bowel movement for a few days.  DIET: Your first meal following the procedure should be a light meal and then it is ok to progress to your normal diet.  A half-sandwich or bowl of soup is an example of a good first meal.  Heavy or fried foods are harder to digest and may make you feel nauseous or bloated.  Likewise meals heavy in dairy and vegetables can cause extra gas to form and this can also increase the bloating.  Drink plenty of fluids but you should avoid alcoholic beverages for 24 hours.  ACTIVITY: Your care partner should take you home directly after the procedure.  You should plan to take it easy, moving slowly for the rest of the day.  You can resume normal activity the day after the procedure however you should NOT DRIVE or use heavy machinery for 24 hours (because of the sedation medicines used during the test).    SYMPTOMS TO REPORT IMMEDIATELY: A gastroenterologist can be reached at any hour.  During normal business hours, 8:30 AM  to 5:00 PM Monday through Friday, call [phone].  After hours and on weekends, please call the GI answering service at [phone] who will take a message and have the physician on call contact you.   Following lower endoscopy (colonoscopy or flexible sigmoidoscopy):  Excessive amounts of blood in the stool  Significant tenderness or worsening of abdominal pains  Swelling of the abdomen that is new, acute  Fever of 100F or higher  Following upper endoscopy (EGD)  Vomiting of blood or coffee ground material  New chest pain or pain under the shoulder blades  Painful or persistently difficult swallowing  New shortness of breath  Fever of 100F or higher  Black, tarry-looking stools  FOLLOW UP: If any biopsies were taken you will be contacted by phone or by letter within the next 1-3 weeks.  Call your gastroenterologist if you have not heard about the biopsies in 3 weeks.  Our staff will call the home number listed on your records the next business day following your procedure to check on you and address any questions or concerns that you may have at that time regarding the information given to you following your procedure. This is a courtesy call and so if there is no answer at the home number and we have not heard from you through the emergency physician on call, we will assume that you have returned to your regular daily activities  without incident.  SIGNATURES/CONFIDENTIALITY: You and/or your care partner have signed paperwork which will be entered into your electronic medical record.  These signatures attest to the fact that that the information above on your After Visit Summary has been reviewed and is understood.  Full responsibility of the confidentiality of this discharge information lies with you and/or your care-partner.

## 2012-06-27 NOTE — Progress Notes (Signed)
Called to room to assist during endoscopic procedure.  Patient ID and intended procedure confirmed with present staff. Received instructions for my participation in the procedure from the performing physician.  

## 2012-06-27 NOTE — Op Note (Signed)
[facility] [address] [place], [address]   COLONOSCOPY PROCEDURE REPORT  PATIENT: [name], [name]  MR#: [id number] BIRTHDATE: Mar 30, 1932 , 80  yrs. old GENDER: Male ENDOSCOPIST: Mardella Layman, MD, [facility] REFERRED BY:  Eustaquio Boyden, M.D. PROCEDURE DATE:  06/27/2012 PROCEDURE:   Colonoscopy with snare polypectomy ASA CLASS:   Class III INDICATIONS:Iron Deficiency Anemia. MEDICATIONS: propofol (Diprivan) 400mg  IV  DESCRIPTION OF PROCEDURE:   After the risks and benefits and of the procedure were explained, informed consent was obtained.        The LB [id number]  endoscope was introduced through the anus and advanced to the cecum, which was identified by both the appendix and ileocecal valve .  The quality of the prep was excellent, using MoviPrep .  The instrument was then slowly withdrawn as the colon was fully examined.     COLON FINDINGS: Two smooth and firm flat polyps were found at the hepatic flexure.  A polypectomy was performed using snare cautery. The resection was complete and the polyp tissue was completely retrieved.   A few smooth sessile polyps ranging between 3-39mm in size were found at the hepatic flexure and in the transverse colon. A polypectomy was performed using snare cautery.  The resection was complete and the polyp tissue was not retrieved.   Severe diverticulosis was noted in the descending colon and sigmoid colon. Retroflexed views revealed no abnormalities.     The scope was then withdrawn from the patient and the procedure completed.  COMPLICATIONS: There were no complications. ENDOSCOPIC IMPRESSION: 1.   Two flat polyps were found at the hepatic flexure; polypectomy was performed using snare cautery ...large,flat linear polyps piecemeal removed in retroflexed view...see pictures. 2.   Few sessile polyps ranging between 3-57mm in size were found at the hepatic flexure and in the transverse colon; polypectomy  was performed using snare cautery 3.   Severe diverticulosis was noted in the descending colon and sigmoid colon  RECOMMENDATIONS: 1.  Continue current medications 2.  Upper endoscopy will be scheduled 3.  Await biopsy results 4.  Repeat Colonoscopy in 1 year.   REPEAT EXAM:  cc:  _______________________________ eSignedMardella Layman, MD, [facility] 06/27/2012 2:50 PM     PATIENT NAME:  [name], [name] MR#: [id number]

## 2012-06-27 NOTE — Progress Notes (Signed)
Patient did not experience any of the following events: a burn prior to discharge; a fall within the facility; wrong site/side/patient/procedure/implant event; or a hospital transfer or hospital admission upon discharge from the facility. (G8907) Patient did not have preoperative order for IV antibiotic SSI prophylaxis. (G8918)  

## 2012-06-28 ENCOUNTER — Telehealth: Payer: Self-pay | Admitting: *Deleted

## 2012-06-28 LAB — HELICOBACTER PYLORI SCREEN-BIOPSY: UREASE: NEGATIVE

## 2012-06-28 NOTE — Telephone Encounter (Signed)
  Follow up Call-  Call back number 06/27/2012  Post procedure Call Back phone  # [phone]  Permission to leave phone message Yes     Patient questions:  Do you have a fever, pain , or abdominal swelling? no Pain Score  0 *  Have you tolerated food without any problems? yes  Have you been able to return to your normal activities? yes  Do you have any questions about your discharge instructions: Diet   no Medications  no Follow up visit  no  Do you have questions or concerns about your Care? yes  Actions: * If pain score is 4 or above: No action needed, pain <4.

## 2012-07-02 ENCOUNTER — Encounter: Payer: Self-pay | Admitting: Gastroenterology

## 2012-07-03 ENCOUNTER — Encounter: Payer: Self-pay | Admitting: Family Medicine

## 2012-07-03 ENCOUNTER — Encounter: Payer: Self-pay | Admitting: Gastroenterology

## 2012-07-06 ENCOUNTER — Encounter: Payer: Self-pay | Admitting: Family Medicine

## 2012-07-16 ENCOUNTER — Encounter: Payer: Self-pay | Admitting: *Deleted

## 2012-07-20 ENCOUNTER — Ambulatory Visit (INDEPENDENT_AMBULATORY_CARE_PROVIDER_SITE_OTHER): Payer: Medicare Other | Admitting: Gastroenterology

## 2012-07-20 ENCOUNTER — Encounter: Payer: Self-pay | Admitting: Gastroenterology

## 2012-07-20 VITALS — BP 130/70 | HR 66 | Ht 72.0 in | Wt 193.0 lb

## 2012-07-20 DIAGNOSIS — Z860100 Personal history of colon polyps, unspecified: Secondary | ICD-10-CM

## 2012-07-20 DIAGNOSIS — Z9889 Other specified postprocedural states: Secondary | ICD-10-CM

## 2012-07-20 DIAGNOSIS — Z8719 Personal history of other diseases of the digestive system: Secondary | ICD-10-CM

## 2012-07-20 DIAGNOSIS — D510 Vitamin B12 deficiency anemia due to intrinsic factor deficiency: Secondary | ICD-10-CM

## 2012-07-20 DIAGNOSIS — Z7982 Long term (current) use of aspirin: Secondary | ICD-10-CM

## 2012-07-20 DIAGNOSIS — Z87448 Personal history of other diseases of urinary system: Secondary | ICD-10-CM

## 2012-07-20 NOTE — Progress Notes (Signed)
History of Present Illness: This is a 79 year old Caucasian male who was referred for colonoscopy.  He had multiple polyps removed from the hepatic flexure of his colon.  These were adenomatous polyps.  The GI workup otherwise was negative and included endoscopy with small bowel biopsy and CLO biopsy.  He currently is asymptomatic.  As part of his workup he was found to be severely B12 deficient, and is on oral B12, not parenteral therapy as needed.  He does have some history of possible peripheral neuropathy.    Current Medications, Allergies, Past Medical History, Past Surgical History, Family History and Social History were reviewed in [employer] record.  ROS: All systems were reviewed and are negative unless otherwise stated in the HPI.         Assessment and plan: Reviewed this patient's labs shows an iron levels are actually normal.  Have asked him to finish his by mouth iron and not to take any more oral iron preparations.  He needs B12 thousand micrograms IM weekly x3 and then these to be on weekly nasal B12 gel.  I do not think he deserves food-bound B12.,  And probably has pernicious anemia.  We'll repeat his colonoscopy in one years time.  Review of labs otherwise were unremarkable.  He is been evaluated for hematuria.   Please copy her primary care physician, referring physician, and pertinent subspecialists.

## 2012-07-20 NOTE — Patient Instructions (Addendum)
You will be due for a recall colonoscopy in 06-2013. We will send you a reminder in the mail when it gets closer to that time.  Please contact your primary care doctor and start your B 12 injections.  Start B12 thousand micrograms IM weekly x3   Then can start nasal B 12   Finish your Iron supplement   Please follow up in one year or sooner if not better. _______________________________________________                                               We are excited to introduce MyChart, a new best-in-class service that provides you online access to important information in your electronic medical record. We want to make it easier for you to view your health information - all in one secure location - when and where you need it. We expect MyChart will enhance the quality of care and service we provide.  When you register for MyChart, you can:    View your test results.    Request appointments and receive appointment reminders via email.    Request medication renewals.    View your medical history, allergies, medications and immunizations.    Communicate with your physician's office through a password-protected site.    Conveniently print information such as your medication lists.  To find out if MyChart is right for you, please talk to a member of our clinical staff today. We will gladly answer your questions about this free health and wellness tool.  If you are age 46 or older and want a member of your family to have access to your record, you must provide written consent by completing a proxy form available at our office. Please speak to our clinical staff about guidelines regarding accounts for patients younger than age 67.  As you activate your MyChart account and need any technical assistance, please call the MyChart technical support line at [phone]) [phone]-CHART [phone]) or email your question to [email or web address]. If you email your question(s), please include your name, a  return phone number and the best time to reach you.  If you have non-urgent health-related questions, you can send a message to our office through MyChart at [email or web address]. If you have a medical emergency, call 911.  Thank you for using MyChart as your new health and wellness resource!   MyChart licensed from [employer],  4540-9811. Patents Pending.

## 2012-07-22 ENCOUNTER — Encounter: Payer: Self-pay | Admitting: Family Medicine

## 2012-07-31 ENCOUNTER — Ambulatory Visit (INDEPENDENT_AMBULATORY_CARE_PROVIDER_SITE_OTHER): Payer: Medicare Other | Admitting: Family Medicine

## 2012-07-31 ENCOUNTER — Encounter: Payer: Self-pay | Admitting: Family Medicine

## 2012-07-31 VITALS — BP 132/70 | HR 72 | Temp 98.6°F | Wt 196.0 lb

## 2012-07-31 DIAGNOSIS — D649 Anemia, unspecified: Secondary | ICD-10-CM

## 2012-07-31 DIAGNOSIS — I10 Essential (primary) hypertension: Secondary | ICD-10-CM

## 2012-07-31 DIAGNOSIS — Z86010 Personal history of colonic polyps: Secondary | ICD-10-CM

## 2012-07-31 DIAGNOSIS — E538 Deficiency of other specified B group vitamins: Secondary | ICD-10-CM

## 2012-07-31 DIAGNOSIS — I4891 Unspecified atrial fibrillation: Secondary | ICD-10-CM

## 2012-07-31 DIAGNOSIS — R21 Rash and other nonspecific skin eruption: Secondary | ICD-10-CM

## 2012-07-31 MED ORDER — CYANOCOBALAMIN 1000 MCG/ML IJ SOLN
1000.0000 ug | Freq: Once | INTRAMUSCULAR | Status: AC
  Administered 2012-07-31: 1000 ug via INTRAMUSCULAR

## 2012-07-31 NOTE — Assessment & Plan Note (Addendum)
Will continue to monitor off medications. BP Readings from Last 3 Encounters:  07/31/12 132/70  07/20/12 130/70  06/27/12 130/61  weight loss contributing (pt states trying) I am unclear why he was on a statin - will await FLP next blood draw and reassess.

## 2012-07-31 NOTE — Patient Instructions (Signed)
I'm sorry about the skin rash, but I'm glad it's better. Return to see me in 3 months for medicare wellness visit. B12 shot today.  This will hopefully help with your energy level. B12 shot weekly for 3 weeks then start monthly for 8 months.  We will set you up for this here.

## 2012-07-31 NOTE — Assessment & Plan Note (Signed)
Derm diagnosed rash as dermatitis (unclear to what). I apologized for misdiagnosis, however am glad that rash has improved on topical steroid by derm. I've asked him to contact dermatologist to send me their note.

## 2012-07-31 NOTE — Assessment & Plan Note (Signed)
We will also start b12 shot weekly for 3 weeks then monthly for 8 months, then start oral supplementation.  Pt agrees with plan. Consider checking intrinsic factor next blood work

## 2012-07-31 NOTE — Assessment & Plan Note (Signed)
S/p colonoscopy - rpt recommended in 1 year.

## 2012-07-31 NOTE — Assessment & Plan Note (Signed)
Resolved s/p ablation.  Heart sounds regular today

## 2012-07-31 NOTE — Assessment & Plan Note (Addendum)
Low % sat, low iron and normal ferritin.  However, low transferrin as well.  ?IDA.  Anticipate b12 deficiency contributing. I recommended pt continue iron supplements daily until runs out. We will also start b12 shot weekly for 3 weeks then monthly for 8 months, then start oral supplementation. Mild leukocytosis, monocytosis, and thrombocytosis.  Will need recheck CBC after started vit B12 supplementation, if persistent will merit further f/u.

## 2012-07-31 NOTE — Progress Notes (Signed)
  Subjective:    Patient ID: [name], male    DOB: 04/04/1932, 79 y.o.   MRN: [id number]  HPI CC: 3 mo f/u  HTN hx - stable off BP meds. HLD hx - stable off statin (stopped 2/2 concern for causation of skin rash).    Dermatitis - last visit seen here with skin rash on trunk and lower extremities, thought due to bed bugs.  Pt states he went to dermatologist afterwards and was diagnosed with dermatitis.  Pt upset about wrong diagnosis of bed bugs last visit.    B12 deficiency - found 04/2012.  Has been taking oral B12 since then.  We had shortage of B12 injections.  Discussed we have IM available again - pt interested.  First shot today.    Recent colonoscopy/endoscopy reviewed - mult polyps as well as duodenal diverticulum.  rec rpt colonoscopy in 1 year ([facility]).  GI does not believe pt has iron def anemia.  Iron/TIBC/Ferritin    Component Value Date/Time   IRON 17* 05/01/2012 1305   FERRITIN 133.0 05/01/2012 1305  transferrin low at 149 % sat low at 8.1% B12 low at 203  Past Medical History  Diagnosis Date  . Atrial fibrillation 2007    s/p ablation  . Glaucoma   . Hypertension   . BPH (benign prostatic hypertrophy) 1989    s/p TURP  . History of chicken pox   . Microhematuria 1994    longstanding per pt, states normal w/u in past (has had cystoscopy, CT)  . Diastolic CHF 04/2012    grade 1  . Diverticulosis of colon (without mention of hemorrhage)   . Colon polyps 06/2012    multiple tubular adenomas, rec rpt 1 yr Jarold Motto)     Review of Systems Per HPI    Objective:   Physical Exam  Nursing note and vitals reviewed. Constitutional: He appears well-developed and well-nourished. No distress.  HENT:  Head: Normocephalic and atraumatic.  Mouth/Throat: Oropharynx is clear and moist. No oropharyngeal exudate.  Eyes: Conjunctivae and EOM are normal. Pupils are equal, round, and reactive to light.  Cardiovascular: Normal rate, regular rhythm, normal heart sounds  and intact distal pulses.   No murmur heard. Pulmonary/Chest: Effort normal and breath sounds normal. No respiratory distress. He has no wheezes. He has no rales.  Musculoskeletal: He exhibits no edema.       Assessment & Plan:

## 2012-08-07 ENCOUNTER — Ambulatory Visit (INDEPENDENT_AMBULATORY_CARE_PROVIDER_SITE_OTHER): Payer: Medicare Other | Admitting: *Deleted

## 2012-08-07 DIAGNOSIS — E538 Deficiency of other specified B group vitamins: Secondary | ICD-10-CM

## 2012-08-07 MED ORDER — CYANOCOBALAMIN 1000 MCG/ML IJ SOLN
1000.0000 ug | Freq: Once | INTRAMUSCULAR | Status: AC
  Administered 2012-08-07: 1000 ug via INTRAMUSCULAR

## 2012-08-12 ENCOUNTER — Encounter: Payer: Self-pay | Admitting: Family Medicine

## 2012-08-14 ENCOUNTER — Ambulatory Visit (INDEPENDENT_AMBULATORY_CARE_PROVIDER_SITE_OTHER): Payer: Medicare Other | Admitting: *Deleted

## 2012-08-14 DIAGNOSIS — E538 Deficiency of other specified B group vitamins: Secondary | ICD-10-CM

## 2012-08-14 MED ORDER — CYANOCOBALAMIN 1000 MCG/ML IJ SOLN
1000.0000 ug | Freq: Once | INTRAMUSCULAR | Status: AC
  Administered 2012-08-14: 1000 ug via INTRAMUSCULAR

## 2012-08-16 ENCOUNTER — Encounter: Payer: Self-pay | Admitting: Gastroenterology

## 2012-08-21 ENCOUNTER — Ambulatory Visit (INDEPENDENT_AMBULATORY_CARE_PROVIDER_SITE_OTHER): Payer: Medicare Other | Admitting: *Deleted

## 2012-08-21 DIAGNOSIS — E538 Deficiency of other specified B group vitamins: Secondary | ICD-10-CM

## 2012-08-21 MED ORDER — CYANOCOBALAMIN 1000 MCG/ML IJ SOLN
1000.0000 ug | Freq: Once | INTRAMUSCULAR | Status: AC
  Administered 2012-08-21: 1000 ug via INTRAMUSCULAR

## 2012-08-29 ENCOUNTER — Other Ambulatory Visit: Payer: Self-pay

## 2012-09-18 ENCOUNTER — Ambulatory Visit: Payer: Self-pay | Admitting: Family Medicine

## 2012-10-15 ENCOUNTER — Ambulatory Visit: Payer: Self-pay | Admitting: Internal Medicine

## 2012-10-15 LAB — RETICULOCYTES
Absolute Retic Count: 0.0472 10*6/uL (ref 0.019–0.186)
Reticulocyte: 1.18 % (ref 0.4–3.1)

## 2012-10-15 LAB — COMPREHENSIVE METABOLIC PANEL
Albumin: 2.7 g/dL — ABNORMAL LOW (ref 3.4–5.0)
Alkaline Phosphatase: 123 U/L (ref 50–136)
Anion Gap: 9 (ref 7–16)
Bilirubin,Total: 0.5 mg/dL (ref 0.2–1.0)
Calcium, Total: 9.5 mg/dL (ref 8.5–10.1)
Chloride: 98 mmol/L (ref 98–107)
Co2: 29 mmol/L (ref 21–32)
Creatinine: 0.94 mg/dL (ref 0.60–1.30)
EGFR (African American): >60
EGFR (Non-African Amer.): >60
Osmolality: 272 (ref 275–301)
SGOT(AST): 17 U/L (ref 15–37)
Sodium: 136 mmol/L (ref 136–145)
Total Protein: 8 g/dL (ref 6.4–8.2)

## 2012-10-15 LAB — CBC CANCER CENTER
Bands: 2 %
Basophil: 3 %
HCT: 31.3 % — ABNORMAL LOW (ref 40.0–52.0)
HGB: 9.9 g/dL — ABNORMAL LOW (ref 13.0–18.0)
MCHC: 31.6 g/dL — ABNORMAL LOW (ref 32.0–36.0)
MCV: 78 fL — ABNORMAL LOW (ref 80–100)
Monocytes: 11 %
Platelet: 591 x10 3/mm — ABNORMAL HIGH (ref 150–440)
RBC: 3.99 10*6/uL — ABNORMAL LOW (ref 4.40–5.90)
RDW: 17.5 % — ABNORMAL HIGH (ref 11.5–14.5)
Segmented Neutrophils: 73 %
WBC: 12.5 x10 3/mm — ABNORMAL HIGH (ref 3.8–10.6)

## 2012-10-15 LAB — IRON AND TIBC
Iron Bind.Cap.(Total): 194 ug/dL — ABNORMAL LOW (ref 250–450)
Iron: 15 ug/dL — ABNORMAL LOW (ref 65–175)

## 2012-10-15 LAB — SEDIMENTATION RATE: Erythrocyte Sed Rate: 95 mm/hr — ABNORMAL HIGH (ref 0–20)

## 2012-10-15 LAB — FERRITIN: Ferritin (ARMC): 226 ng/mL (ref 8–388)

## 2012-10-15 LAB — FOLATE: Folic Acid: 27.4 ng/mL (ref 3.1–100.0)

## 2012-10-15 LAB — APTT: Activated PTT: 31.7 secs (ref 23.6–35.9)

## 2012-10-16 LAB — CEA: CEA: 0.4 ng/mL (ref 0.0–4.7)

## 2012-10-18 ENCOUNTER — Ambulatory Visit: Payer: Self-pay | Admitting: Internal Medicine

## 2012-10-21 ENCOUNTER — Other Ambulatory Visit: Payer: Self-pay | Admitting: Family Medicine

## 2012-10-21 DIAGNOSIS — N400 Enlarged prostate without lower urinary tract symptoms: Secondary | ICD-10-CM

## 2012-10-21 DIAGNOSIS — E538 Deficiency of other specified B group vitamins: Secondary | ICD-10-CM

## 2012-10-21 DIAGNOSIS — D649 Anemia, unspecified: Secondary | ICD-10-CM

## 2012-10-21 DIAGNOSIS — I10 Essential (primary) hypertension: Secondary | ICD-10-CM

## 2012-10-24 ENCOUNTER — Ambulatory Visit: Payer: Self-pay | Admitting: Internal Medicine

## 2012-10-30 ENCOUNTER — Other Ambulatory Visit: Payer: Medicare Other

## 2012-10-30 LAB — CBC CANCER CENTER
Basophil: 3 %
HGB: 10 g/dL — ABNORMAL LOW (ref 13.0–18.0)
MCH: 25.2 pg — ABNORMAL LOW (ref 26.0–34.0)
MCHC: 32.5 g/dL (ref 32.0–36.0)
Monocytes: 13 %
Platelet: 572 x10 3/mm — ABNORMAL HIGH (ref 150–440)
RBC: 3.96 10*6/uL — ABNORMAL LOW (ref 4.40–5.90)
RDW: 17.7 % — ABNORMAL HIGH (ref 11.5–14.5)
Segmented Neutrophils: 76 %
WBC: 11.5 x10 3/mm — ABNORMAL HIGH (ref 3.8–10.6)

## 2012-10-30 LAB — RETICULOCYTES: Reticulocyte: 1.58 % (ref 0.4–3.1)

## 2012-11-06 ENCOUNTER — Encounter: Payer: Medicare Other | Admitting: Family Medicine

## 2012-11-13 LAB — CBC CANCER CENTER
Basophil #: 0.2 x10 3/mm — ABNORMAL HIGH (ref 0.0–0.1)
Basophil %: 2 %
Eosinophil %: 0.3 %
HGB: 9.7 g/dL — ABNORMAL LOW (ref 13.0–18.0)
Lymphocyte #: 1.3 x10 3/mm (ref 1.0–3.6)
Lymphocyte %: 10.8 %
MCH: 25.1 pg — ABNORMAL LOW (ref 26.0–34.0)
MCV: 76 fL — ABNORMAL LOW (ref 80–100)
Monocyte #: 2.3 x10 3/mm — ABNORMAL HIGH (ref 0.2–1.0)
Neutrophil #: 8.3 x10 3/mm — ABNORMAL HIGH (ref 1.4–6.5)
RBC: 3.86 10*6/uL — ABNORMAL LOW (ref 4.40–5.90)
RDW: 18.3 % — ABNORMAL HIGH (ref 11.5–14.5)
WBC: 12.2 x10 3/mm — ABNORMAL HIGH (ref 3.8–10.6)

## 2012-11-13 LAB — COMPREHENSIVE METABOLIC PANEL
Albumin: 2.6 g/dL — ABNORMAL LOW (ref 3.4–5.0)
Anion Gap: 4 — ABNORMAL LOW (ref 7–16)
BUN: 12 mg/dL (ref 7–18)
Bilirubin,Total: 0.5 mg/dL (ref 0.2–1.0)
Glucose: 92 mg/dL (ref 65–99)
Osmolality: 266 (ref 275–301)
Potassium: 4 mmol/L (ref 3.5–5.1)
SGOT(AST): 17 U/L (ref 15–37)
SGPT (ALT): 18 U/L (ref 12–78)
Sodium: 133 mmol/L — ABNORMAL LOW (ref 136–145)
Total Protein: 7.9 g/dL (ref 6.4–8.2)

## 2012-11-13 LAB — LACTATE DEHYDROGENASE: LDH: 88 U/L (ref 85–241)

## 2012-11-24 ENCOUNTER — Ambulatory Visit: Payer: Self-pay | Admitting: Internal Medicine

## 2012-11-27 LAB — BASIC METABOLIC PANEL
Anion Gap: 8 (ref 7–16)
BUN: 10 mg/dL (ref 7–18)
Calcium, Total: 9 mg/dL (ref 8.5–10.1)
Chloride: 100 mmol/L (ref 98–107)
Co2: 27 mmol/L (ref 21–32)
Creatinine: 0.97 mg/dL (ref 0.60–1.30)
EGFR (African American): >60
Glucose: 191 mg/dL — ABNORMAL HIGH (ref 65–99)
Potassium: 4.2 mmol/L (ref 3.5–5.1)
Sodium: 135 mmol/L — ABNORMAL LOW (ref 136–145)

## 2012-11-27 LAB — CBC CANCER CENTER
Basophil #: 0.2 x10 3/mm — ABNORMAL HIGH (ref 0.0–0.1)
Basophil %: 1.3 %
Eosinophil #: 0.2 x10 3/mm (ref 0.0–0.7)
HGB: 9.6 g/dL — ABNORMAL LOW (ref 13.0–18.0)
MCH: 23.9 pg — ABNORMAL LOW (ref 26.0–34.0)
Monocyte %: 12.2 %
Platelet: 567 x10 3/mm — ABNORMAL HIGH (ref 150–440)
RDW: 18.4 % — ABNORMAL HIGH (ref 11.5–14.5)
WBC: 13.3 x10 3/mm — ABNORMAL HIGH (ref 3.8–10.6)

## 2012-11-29 ENCOUNTER — Other Ambulatory Visit: Payer: Self-pay

## 2012-12-11 LAB — BASIC METABOLIC PANEL
Anion Gap: 6 — ABNORMAL LOW (ref 7–16)
BUN: 12 mg/dL (ref 7–18)
Chloride: 99 mmol/L (ref 98–107)
Co2: 30 mmol/L (ref 21–32)
Glucose: 205 mg/dL — ABNORMAL HIGH (ref 65–99)
Osmolality: 276 (ref 275–301)

## 2012-12-11 LAB — CBC CANCER CENTER
Basophil %: 0.8 %
Eosinophil %: 0.4 %
HCT: 32.4 % — ABNORMAL LOW (ref 40.0–52.0)
HGB: 10.2 g/dL — ABNORMAL LOW (ref 13.0–18.0)
Lymphocyte #: 1.5 x10 3/mm (ref 1.0–3.6)
MCH: 24 pg — ABNORMAL LOW (ref 26.0–34.0)
MCV: 76 fL — ABNORMAL LOW (ref 80–100)
Monocyte #: 1.2 x10 3/mm — ABNORMAL HIGH (ref 0.2–1.0)
Monocyte %: 6.8 %
Neutrophil #: 14.4 x10 3/mm — ABNORMAL HIGH (ref 1.4–6.5)
Platelet: 714 x10 3/mm — ABNORMAL HIGH (ref 150–440)
RDW: 18.8 % — ABNORMAL HIGH (ref 11.5–14.5)

## 2012-12-11 LAB — HEPATIC FUNCTION PANEL A (ARMC)
Alkaline Phosphatase: 111 U/L (ref 50–136)
Bilirubin,Total: 0.8 mg/dL (ref 0.2–1.0)
SGPT (ALT): 21 U/L (ref 12–78)

## 2012-12-18 LAB — CBC CANCER CENTER
Basophil: 1 %
HCT: 33.3 % — ABNORMAL LOW (ref 40.0–52.0)
HGB: 10.4 g/dL — ABNORMAL LOW (ref 13.0–18.0)
Lymphocytes: 1 %
MCHC: 31.3 g/dL — ABNORMAL LOW (ref 32.0–36.0)
MCV: 77 fL — ABNORMAL LOW (ref 80–100)
Platelet: 500 x10 3/mm — ABNORMAL HIGH (ref 150–440)
RBC: 4.35 10*6/uL — ABNORMAL LOW (ref 4.40–5.90)
WBC: 47.5 x10 3/mm — ABNORMAL HIGH (ref 3.8–10.6)

## 2012-12-24 ENCOUNTER — Ambulatory Visit: Payer: Self-pay | Admitting: Internal Medicine

## 2012-12-25 LAB — BASIC METABOLIC PANEL
Anion Gap: 8 (ref 7–16)
BUN: 11 mg/dL (ref 7–18)
Calcium, Total: 8.9 mg/dL (ref 8.5–10.1)
Co2: 26 mmol/L (ref 21–32)
Creatinine: 0.84 mg/dL (ref 0.60–1.30)
EGFR (African American): >60
EGFR (Non-African Amer.): >60
Glucose: 196 mg/dL — ABNORMAL HIGH (ref 65–99)
Sodium: 134 mmol/L — ABNORMAL LOW (ref 136–145)

## 2012-12-25 LAB — CBC CANCER CENTER
Eosinophil #: 0.3 x10 3/mm (ref 0.0–0.7)
Eosinophil %: 2.1 %
HGB: 9.2 g/dL — ABNORMAL LOW (ref 13.0–18.0)
Lymphocyte %: 3.5 %
MCH: 24.3 pg — ABNORMAL LOW (ref 26.0–34.0)
MCHC: 32.1 g/dL (ref 32.0–36.0)
Monocyte #: 1.3 x10 3/mm — ABNORMAL HIGH (ref 0.2–1.0)
Neutrophil #: 9.7 x10 3/mm — ABNORMAL HIGH (ref 1.4–6.5)
Neutrophil %: 81.9 %
Platelet: 427 x10 3/mm (ref 150–440)
RBC: 3.79 10*6/uL — ABNORMAL LOW (ref 4.40–5.90)
RDW: 20.4 % — ABNORMAL HIGH (ref 11.5–14.5)
WBC: 11.9 x10 3/mm — ABNORMAL HIGH (ref 3.8–10.6)

## 2012-12-25 LAB — HEPATIC FUNCTION PANEL A (ARMC)
Albumin: 2.3 g/dL — ABNORMAL LOW (ref 3.4–5.0)
SGOT(AST): 41 U/L — ABNORMAL HIGH (ref 15–37)

## 2012-12-25 LAB — URIC ACID: Uric Acid: 3.4 mg/dL — ABNORMAL LOW (ref 3.5–7.2)

## 2013-01-01 LAB — CBC CANCER CENTER
Basophil #: 0.2 x10 3/mm — ABNORMAL HIGH (ref 0.0–0.1)
Eosinophil #: 0.2 x10 3/mm (ref 0.0–0.7)
Eosinophil %: 2 %
HGB: 9 g/dL — ABNORMAL LOW (ref 13.0–18.0)
Lymphocyte #: 0.6 x10 3/mm — ABNORMAL LOW (ref 1.0–3.6)
Lymphocyte %: 5.8 %
MCHC: 31.8 g/dL — ABNORMAL LOW (ref 32.0–36.0)
Monocyte %: 12.8 %
Neutrophil #: 7.6 x10 3/mm — ABNORMAL HIGH (ref 1.4–6.5)
Neutrophil %: 77 %
RDW: 20.8 % — ABNORMAL HIGH (ref 11.5–14.5)

## 2013-01-01 LAB — CREATININE, SERUM
Creatinine: 0.81 mg/dL (ref 0.60–1.30)
EGFR (Non-African Amer.): >60

## 2013-01-10 LAB — CBC CANCER CENTER
Eosinophil #: 0.4 x10 3/mm (ref 0.0–0.7)
Eosinophil %: 1.8 %
Lymphocyte #: 0.2 x10 3/mm — ABNORMAL LOW (ref 1.0–3.6)
Lymphocyte %: 0.9 %
MCHC: 30.8 g/dL — ABNORMAL LOW (ref 32.0–36.0)
MCV: 78 fL — ABNORMAL LOW (ref 80–100)
Monocyte #: 1.2 x10 3/mm — ABNORMAL HIGH (ref 0.2–1.0)
Neutrophil %: 91.4 %
Platelet: 338 x10 3/mm (ref 150–440)
RBC: 3.8 10*6/uL — ABNORMAL LOW (ref 4.40–5.90)
RDW: 22.5 % — ABNORMAL HIGH (ref 11.5–14.5)

## 2013-01-10 LAB — CREATININE, SERUM: Creatinine: 0.88 mg/dL (ref 0.60–1.30)

## 2013-01-15 LAB — CBC CANCER CENTER
Basophil %: 1.8 %
Eosinophil #: 0.5 x10 3/mm (ref 0.0–0.7)
Eosinophil %: 6.2 %
HCT: 29 % — ABNORMAL LOW (ref 40.0–52.0)
HGB: 9.1 g/dL — ABNORMAL LOW (ref 13.0–18.0)
Lymphocyte #: 0.3 x10 3/mm — ABNORMAL LOW (ref 1.0–3.6)
Lymphocyte %: 3.7 %
MCH: 25 pg — ABNORMAL LOW (ref 26.0–34.0)
MCHC: 31.4 g/dL — ABNORMAL LOW (ref 32.0–36.0)
MCV: 80 fL (ref 80–100)
Neutrophil #: 6.8 x10 3/mm — ABNORMAL HIGH (ref 1.4–6.5)
Neutrophil %: 77.2 %
RBC: 3.64 10*6/uL — ABNORMAL LOW (ref 4.40–5.90)
WBC: 8.8 x10 3/mm (ref 3.8–10.6)

## 2013-01-22 LAB — CBC CANCER CENTER
Basophil %: 2.3 %
HCT: 29.7 % — ABNORMAL LOW (ref 40.0–52.0)
HGB: 9.4 g/dL — ABNORMAL LOW (ref 13.0–18.0)
Lymphocyte #: 0.3 x10 3/mm — ABNORMAL LOW (ref 1.0–3.6)
Lymphocyte %: 2.9 %
MCV: 80 fL (ref 80–100)
Monocyte #: 1 x10 3/mm (ref 0.2–1.0)
Monocyte %: 9.9 %
Neutrophil %: 82.6 %
RBC: 3.7 10*6/uL — ABNORMAL LOW (ref 4.40–5.90)
WBC: 10.2 x10 3/mm (ref 3.8–10.6)

## 2013-01-22 LAB — CREATININE, SERUM
EGFR (African American): >60
EGFR (Non-African Amer.): >60

## 2013-01-24 ENCOUNTER — Ambulatory Visit: Payer: Self-pay | Admitting: Internal Medicine

## 2013-01-30 LAB — CBC CANCER CENTER
BASOS ABS: 0.2 x10 3/mm — AB (ref 0.0–0.1)
Basophil %: 0.7 %
Eosinophil #: 0.6 x10 3/mm (ref 0.0–0.7)
Eosinophil %: 2.1 %
HCT: 29.4 % — ABNORMAL LOW (ref 40.0–52.0)
HGB: 9.2 g/dL — AB (ref 13.0–18.0)
LYMPHS ABS: 0.4 x10 3/mm — AB (ref 1.0–3.6)
LYMPHS PCT: 1.2 %
MCH: 25.6 pg — ABNORMAL LOW (ref 26.0–34.0)
MCHC: 31.2 g/dL — AB (ref 32.0–36.0)
MCV: 82 fL (ref 80–100)
MONO ABS: 1.9 x10 3/mm — AB (ref 0.2–1.0)
Monocyte %: 6.3 %
Neutrophil #: 26.6 x10 3/mm — ABNORMAL HIGH (ref 1.4–6.5)
Neutrophil %: 89.7 %
PLATELETS: 350 x10 3/mm (ref 150–440)
RBC: 3.58 10*6/uL — ABNORMAL LOW (ref 4.40–5.90)
RDW: 26.6 % — ABNORMAL HIGH (ref 11.5–14.5)
WBC: 29.7 x10 3/mm — AB (ref 3.8–10.6)

## 2013-02-06 LAB — CBC CANCER CENTER
BASOS ABS: 0.3 x10 3/mm — AB (ref 0.0–0.1)
BASOS PCT: 2.4 %
EOS ABS: 0.5 x10 3/mm (ref 0.0–0.7)
Eosinophil %: 4.3 %
HCT: 29.3 % — AB (ref 40.0–52.0)
HGB: 9.2 g/dL — ABNORMAL LOW (ref 13.0–18.0)
Lymphocyte #: 0.3 x10 3/mm — ABNORMAL LOW (ref 1.0–3.6)
Lymphocyte %: 2.4 %
MCH: 25.8 pg — ABNORMAL LOW (ref 26.0–34.0)
MCHC: 31.5 g/dL — ABNORMAL LOW (ref 32.0–36.0)
MCV: 82 fL (ref 80–100)
MONO ABS: 1.3 x10 3/mm — AB (ref 0.2–1.0)
MONOS PCT: 10.5 %
NEUTROS ABS: 9.7 x10 3/mm — AB (ref 1.4–6.5)
Neutrophil %: 80.4 %
Platelet: 447 x10 3/mm — ABNORMAL HIGH (ref 150–440)
RBC: 3.57 10*6/uL — ABNORMAL LOW (ref 4.40–5.90)
RDW: 25.8 % — AB (ref 11.5–14.5)
WBC: 12 x10 3/mm — ABNORMAL HIGH (ref 3.8–10.6)

## 2013-02-06 LAB — COMPREHENSIVE METABOLIC PANEL
ALBUMIN: 2.7 g/dL — AB (ref 3.4–5.0)
ALK PHOS: 110 U/L
ALT: 17 U/L (ref 12–78)
Anion Gap: 4 — ABNORMAL LOW (ref 7–16)
BILIRUBIN TOTAL: 0.4 mg/dL (ref 0.2–1.0)
BUN: 9 mg/dL (ref 7–18)
CO2: 29 mmol/L (ref 21–32)
Calcium, Total: 9.1 mg/dL (ref 8.5–10.1)
Chloride: 103 mmol/L (ref 98–107)
Creatinine: 0.8 mg/dL (ref 0.60–1.30)
EGFR (Non-African Amer.): >60
GLUCOSE: 175 mg/dL — AB (ref 65–99)
Osmolality: 275 (ref 275–301)
Potassium: 4.1 mmol/L (ref 3.5–5.1)
SGOT(AST): 18 U/L (ref 15–37)
Sodium: 136 mmol/L (ref 136–145)
Total Protein: 6.8 g/dL (ref 6.4–8.2)

## 2013-02-06 LAB — URIC ACID: Uric Acid: 4.2 mg/dL (ref 3.5–7.2)

## 2013-02-12 LAB — COMPREHENSIVE METABOLIC PANEL
ALBUMIN: 2.9 g/dL — AB (ref 3.4–5.0)
ANION GAP: 8 (ref 7–16)
AST: 14 U/L — AB (ref 15–37)
Alkaline Phosphatase: 91 U/L
BUN: 9 mg/dL (ref 7–18)
Bilirubin,Total: 0.4 mg/dL (ref 0.2–1.0)
Calcium, Total: 8.4 mg/dL — ABNORMAL LOW (ref 8.5–10.1)
Chloride: 101 mmol/L (ref 98–107)
Co2: 29 mmol/L (ref 21–32)
Creatinine: 0.87 mg/dL (ref 0.60–1.30)
EGFR (Non-African Amer.): >60
GLUCOSE: 156 mg/dL — AB (ref 65–99)
OSMOLALITY: 278 (ref 275–301)
Potassium: 3.8 mmol/L (ref 3.5–5.1)
SGPT (ALT): 15 U/L (ref 12–78)
SODIUM: 138 mmol/L (ref 136–145)
TOTAL PROTEIN: 6.9 g/dL (ref 6.4–8.2)

## 2013-02-12 LAB — CBC CANCER CENTER
Basophil #: 0.3 x10 3/mm — ABNORMAL HIGH (ref 0.0–0.1)
Basophil %: 3.2 %
Eosinophil #: 0.3 x10 3/mm (ref 0.0–0.7)
Eosinophil %: 2.7 %
HCT: 30.1 % — AB (ref 40.0–52.0)
HGB: 9.6 g/dL — ABNORMAL LOW (ref 13.0–18.0)
Lymphocyte #: 0.5 x10 3/mm — ABNORMAL LOW (ref 1.0–3.6)
Lymphocyte %: 4.2 %
MCH: 26.4 pg (ref 26.0–34.0)
MCHC: 32 g/dL (ref 32.0–36.0)
MCV: 83 fL (ref 80–100)
MONOS PCT: 13.5 %
Monocyte #: 1.5 x10 3/mm — ABNORMAL HIGH (ref 0.2–1.0)
Neutrophil #: 8.4 x10 3/mm — ABNORMAL HIGH (ref 1.4–6.5)
Neutrophil %: 76.4 %
Platelet: 555 x10 3/mm — ABNORMAL HIGH (ref 150–440)
RBC: 3.65 10*6/uL — AB (ref 4.40–5.90)
RDW: 25.6 % — ABNORMAL HIGH (ref 11.5–14.5)
WBC: 11 x10 3/mm — AB (ref 3.8–10.6)

## 2013-02-19 LAB — CBC CANCER CENTER
Basophil #: 0.2 x10 3/mm — ABNORMAL HIGH (ref 0.0–0.1)
Basophil %: 0.4 %
Eosinophil #: 1.1 x10 3/mm — ABNORMAL HIGH (ref 0.0–0.7)
Eosinophil %: 2.5 %
HCT: 31.4 % — AB (ref 40.0–52.0)
HGB: 9.9 g/dL — ABNORMAL LOW (ref 13.0–18.0)
LYMPHS ABS: 0.3 x10 3/mm — AB (ref 1.0–3.6)
Lymphocyte %: 0.7 %
MCH: 26.8 pg (ref 26.0–34.0)
MCHC: 31.4 g/dL — ABNORMAL LOW (ref 32.0–36.0)
MCV: 85 fL (ref 80–100)
MONOS PCT: 4.6 %
Monocyte #: 2 x10 3/mm — ABNORMAL HIGH (ref 0.2–1.0)
Neutrophil #: 39.2 x10 3/mm — ABNORMAL HIGH (ref 1.4–6.5)
Neutrophil %: 91.8 %
PLATELETS: 349 x10 3/mm (ref 150–440)
RBC: 3.68 10*6/uL — ABNORMAL LOW (ref 4.40–5.90)
RDW: 26.5 % — ABNORMAL HIGH (ref 11.5–14.5)
WBC: 42.7 x10 3/mm — AB (ref 3.8–10.6)

## 2013-02-24 ENCOUNTER — Ambulatory Visit: Payer: Self-pay | Admitting: Internal Medicine

## 2013-02-26 LAB — BASIC METABOLIC PANEL
ANION GAP: 8 (ref 7–16)
BUN: 11 mg/dL (ref 7–18)
CALCIUM: 8.1 mg/dL — AB (ref 8.5–10.1)
CHLORIDE: 103 mmol/L (ref 98–107)
CO2: 28 mmol/L (ref 21–32)
Creatinine: 0.82 mg/dL (ref 0.60–1.30)
Glucose: 171 mg/dL — ABNORMAL HIGH (ref 65–99)
Osmolality: 281 (ref 275–301)
Potassium: 3.9 mmol/L (ref 3.5–5.1)
Sodium: 139 mmol/L (ref 136–145)

## 2013-02-26 LAB — CBC CANCER CENTER
BASOS PCT: 1.3 %
Basophil #: 0.2 x10 3/mm — ABNORMAL HIGH (ref 0.0–0.1)
EOS PCT: 3.2 %
Eosinophil #: 0.4 x10 3/mm (ref 0.0–0.7)
HCT: 30.7 % — ABNORMAL LOW (ref 40.0–52.0)
HGB: 9.8 g/dL — AB (ref 13.0–18.0)
LYMPHS ABS: 0.4 x10 3/mm — AB (ref 1.0–3.6)
Lymphocyte %: 2.9 %
MCH: 27.4 pg (ref 26.0–34.0)
MCHC: 31.8 g/dL — AB (ref 32.0–36.0)
MCV: 86 fL (ref 80–100)
Monocyte #: 1.1 x10 3/mm — ABNORMAL HIGH (ref 0.2–1.0)
Monocyte %: 8.9 %
NEUTROS PCT: 83.7 %
Neutrophil #: 10.2 x10 3/mm — ABNORMAL HIGH (ref 1.4–6.5)
Platelet: 318 x10 3/mm (ref 150–440)
RBC: 3.57 10*6/uL — ABNORMAL LOW (ref 4.40–5.90)
RDW: 25.5 % — ABNORMAL HIGH (ref 11.5–14.5)
WBC: 12.3 x10 3/mm — AB (ref 3.8–10.6)

## 2013-02-26 LAB — HEPATIC FUNCTION PANEL A (ARMC)
Albumin: 2.9 g/dL — ABNORMAL LOW (ref 3.4–5.0)
Alkaline Phosphatase: 125 U/L — ABNORMAL HIGH
BILIRUBIN DIRECT: 0.2 mg/dL (ref 0.00–0.20)
BILIRUBIN TOTAL: 0.5 mg/dL (ref 0.2–1.0)
SGOT(AST): 14 U/L — ABNORMAL LOW (ref 15–37)
SGPT (ALT): 12 U/L (ref 12–78)
Total Protein: 6.9 g/dL (ref 6.4–8.2)

## 2013-03-05 LAB — CBC CANCER CENTER
BASOS ABS: 0.3 x10 3/mm — AB (ref 0.0–0.1)
BASOS PCT: 3.7 %
EOS ABS: 0.4 x10 3/mm (ref 0.0–0.7)
Eosinophil %: 5.7 %
HCT: 29.5 % — ABNORMAL LOW (ref 40.0–52.0)
HGB: 9.6 g/dL — ABNORMAL LOW (ref 13.0–18.0)
LYMPHS ABS: 0.4 x10 3/mm — AB (ref 1.0–3.6)
LYMPHS PCT: 5.7 %
MCH: 28.2 pg (ref 26.0–34.0)
MCHC: 32.4 g/dL (ref 32.0–36.0)
MCV: 87 fL (ref 80–100)
MONOS PCT: 13.3 %
Monocyte #: 1 x10 3/mm (ref 0.2–1.0)
Neutrophil #: 5.3 x10 3/mm (ref 1.4–6.5)
Neutrophil %: 71.6 %
PLATELETS: 324 x10 3/mm (ref 150–440)
RBC: 3.39 10*6/uL — ABNORMAL LOW (ref 4.40–5.90)
RDW: 23.9 % — ABNORMAL HIGH (ref 11.5–14.5)
WBC: 7.4 x10 3/mm (ref 3.8–10.6)

## 2013-03-05 LAB — COMPREHENSIVE METABOLIC PANEL
ANION GAP: 7 (ref 7–16)
Albumin: 2.9 g/dL — ABNORMAL LOW (ref 3.4–5.0)
Alkaline Phosphatase: 96 U/L
BILIRUBIN TOTAL: 0.4 mg/dL (ref 0.2–1.0)
BUN: 9 mg/dL (ref 7–18)
CO2: 30 mmol/L (ref 21–32)
Calcium, Total: 8 mg/dL — ABNORMAL LOW (ref 8.5–10.1)
Chloride: 102 mmol/L (ref 98–107)
Creatinine: 0.82 mg/dL (ref 0.60–1.30)
EGFR (Non-African Amer.): >60
GLUCOSE: 171 mg/dL — AB (ref 65–99)
OSMOLALITY: 280 (ref 275–301)
Potassium: 3.8 mmol/L (ref 3.5–5.1)
SGOT(AST): 14 U/L — ABNORMAL LOW (ref 15–37)
SGPT (ALT): 14 U/L (ref 12–78)
Sodium: 139 mmol/L (ref 136–145)
Total Protein: 6.8 g/dL (ref 6.4–8.2)

## 2013-03-05 LAB — URIC ACID: Uric Acid: 3.6 mg/dL (ref 3.5–7.2)

## 2013-03-08 LAB — CBC CANCER CENTER
BANDS NEUTROPHIL: 2 %
BASOS ABS: 2 %
EOS PCT: 6 %
HCT: 28.9 % — AB (ref 40.0–52.0)
HGB: 9.3 g/dL — AB (ref 13.0–18.0)
Lymphocytes: 3 %
MCH: 28.6 pg (ref 26.0–34.0)
MCHC: 32.3 g/dL (ref 32.0–36.0)
MCV: 89 fL (ref 80–100)
Monocytes: 7 %
Platelet: 309 x10 3/mm (ref 150–440)
RBC: 3.26 10*6/uL — AB (ref 4.40–5.90)
RDW: 23.2 % — AB (ref 11.5–14.5)
Segmented Neutrophils: 78 %
Variant Lymphocyte: 2 %
WBC: 8.7 x10 3/mm (ref 3.8–10.6)

## 2013-03-19 LAB — CBC CANCER CENTER
BASOS PCT: 2.8 %
Basophil #: 0.2 x10 3/mm — ABNORMAL HIGH (ref 0.0–0.1)
Eosinophil #: 0.3 x10 3/mm (ref 0.0–0.7)
Eosinophil %: 4.4 %
HCT: 29.9 % — AB (ref 40.0–52.0)
HGB: 9.8 g/dL — ABNORMAL LOW (ref 13.0–18.0)
LYMPHS ABS: 0.6 x10 3/mm — AB (ref 1.0–3.6)
LYMPHS PCT: 8.5 %
MCH: 29.1 pg (ref 26.0–34.0)
MCHC: 32.7 g/dL (ref 32.0–36.0)
MCV: 89 fL (ref 80–100)
Monocyte #: 1.1 x10 3/mm — ABNORMAL HIGH (ref 0.2–1.0)
Monocyte %: 15.6 %
NEUTROS ABS: 4.9 x10 3/mm (ref 1.4–6.5)
Neutrophil %: 68.7 %
PLATELETS: 322 x10 3/mm (ref 150–440)
RBC: 3.37 10*6/uL — AB (ref 4.40–5.90)
RDW: 20.7 % — AB (ref 11.5–14.5)
WBC: 7.1 x10 3/mm (ref 3.8–10.6)

## 2013-03-19 LAB — BASIC METABOLIC PANEL
Anion Gap: 6 — ABNORMAL LOW (ref 7–16)
BUN: 12 mg/dL (ref 7–18)
CALCIUM: 8.1 mg/dL — AB (ref 8.5–10.1)
CHLORIDE: 102 mmol/L (ref 98–107)
CO2: 30 mmol/L (ref 21–32)
CREATININE: 0.8 mg/dL (ref 0.60–1.30)
EGFR (African American): >60
GLUCOSE: 125 mg/dL — AB (ref 65–99)
Osmolality: 277 (ref 275–301)
POTASSIUM: 3.9 mmol/L (ref 3.5–5.1)
SODIUM: 138 mmol/L (ref 136–145)

## 2013-03-24 ENCOUNTER — Ambulatory Visit: Payer: Self-pay | Admitting: Internal Medicine

## 2013-03-26 LAB — CBC CANCER CENTER
Basophil #: 0.4 x10 3/mm — ABNORMAL HIGH (ref 0.0–0.1)
Basophil %: 0.7 %
Eosinophil #: 0.6 x10 3/mm (ref 0.0–0.7)
Eosinophil %: 1.2 %
HCT: 32.6 % — ABNORMAL LOW (ref 40.0–52.0)
HGB: 10.2 g/dL — AB (ref 13.0–18.0)
Lymphocyte #: 0.4 x10 3/mm — ABNORMAL LOW (ref 1.0–3.6)
Lymphocyte %: 0.9 %
MCH: 28.3 pg (ref 26.0–34.0)
MCHC: 31.3 g/dL — ABNORMAL LOW (ref 32.0–36.0)
MCV: 90 fL (ref 80–100)
MONOS PCT: 3.9 %
Monocyte #: 2 x10 3/mm — ABNORMAL HIGH (ref 0.2–1.0)
NEUTROS ABS: 46.9 x10 3/mm — AB (ref 1.4–6.5)
Neutrophil %: 93.3 %
PLATELETS: 320 x10 3/mm (ref 150–440)
RBC: 3.61 10*6/uL — ABNORMAL LOW (ref 4.40–5.90)
RDW: 20.2 % — AB (ref 11.5–14.5)
WBC: 50.3 x10 3/mm — AB (ref 3.8–10.6)

## 2013-04-02 LAB — BASIC METABOLIC PANEL
Anion Gap: 5 — ABNORMAL LOW (ref 7–16)
BUN: 14 mg/dL (ref 7–18)
CALCIUM: 8.9 mg/dL (ref 8.5–10.1)
CO2: 32 mmol/L (ref 21–32)
CREATININE: 0.88 mg/dL (ref 0.60–1.30)
Chloride: 101 mmol/L (ref 98–107)
EGFR (African American): >60
GLUCOSE: 141 mg/dL — AB (ref 65–99)
OSMOLALITY: 279 (ref 275–301)
Potassium: 3.9 mmol/L (ref 3.5–5.1)
SODIUM: 138 mmol/L (ref 136–145)

## 2013-04-02 LAB — CBC CANCER CENTER
Basophil #: 0.2 x10 3/mm — ABNORMAL HIGH (ref 0.0–0.1)
Basophil %: 1.3 %
EOS ABS: 0.4 x10 3/mm (ref 0.0–0.7)
Eosinophil %: 2.6 %
HCT: 32.8 % — ABNORMAL LOW (ref 40.0–52.0)
HGB: 10.4 g/dL — AB (ref 13.0–18.0)
Lymphocyte #: 0.4 x10 3/mm — ABNORMAL LOW (ref 1.0–3.6)
Lymphocyte %: 2.2 %
MCH: 28.6 pg (ref 26.0–34.0)
MCHC: 31.7 g/dL — AB (ref 32.0–36.0)
MCV: 90 fL (ref 80–100)
MONOS PCT: 6.1 %
Monocyte #: 1 x10 3/mm (ref 0.2–1.0)
Neutrophil #: 14.2 x10 3/mm — ABNORMAL HIGH (ref 1.4–6.5)
Neutrophil %: 87.8 %
PLATELETS: 309 x10 3/mm (ref 150–440)
RBC: 3.64 10*6/uL — AB (ref 4.40–5.90)
RDW: 19.5 % — ABNORMAL HIGH (ref 11.5–14.5)
WBC: 16.2 x10 3/mm — ABNORMAL HIGH (ref 3.8–10.6)

## 2013-04-02 LAB — HEPATIC FUNCTION PANEL A (ARMC)
Albumin: 3 g/dL — ABNORMAL LOW (ref 3.4–5.0)
Alkaline Phosphatase: 126 U/L — ABNORMAL HIGH
BILIRUBIN DIRECT: 0.1 mg/dL (ref 0.00–0.20)
Bilirubin,Total: 0.5 mg/dL (ref 0.2–1.0)
SGOT(AST): 11 U/L — ABNORMAL LOW (ref 15–37)
SGPT (ALT): 12 U/L (ref 12–78)
Total Protein: 6.9 g/dL (ref 6.4–8.2)

## 2013-04-02 LAB — URIC ACID: URIC ACID: 4.8 mg/dL (ref 3.5–7.2)

## 2013-04-09 LAB — URINALYSIS, COMPLETE
BLOOD: NEGATIVE
Bacteria: NONE SEEN
Bilirubin,UR: NEGATIVE
Glucose,UR: NEGATIVE mg/dL (ref 0–75)
Ketone: NEGATIVE
Nitrite: NEGATIVE
Ph: 7 (ref 4.5–8.0)
Protein: NEGATIVE
SPECIFIC GRAVITY: 1.01 (ref 1.003–1.030)
Squamous Epithelial: NONE SEEN
WBC UR: <1 /HPF (ref 0–5)

## 2013-04-09 LAB — CBC CANCER CENTER
Basophil #: 0.3 x10 3/mm — ABNORMAL HIGH (ref 0.0–0.1)
Basophil %: 2.1 %
Eosinophil #: 0.3 x10 3/mm (ref 0.0–0.7)
Eosinophil %: 2.6 %
HCT: 31.7 % — ABNORMAL LOW (ref 40.0–52.0)
HGB: 10.2 g/dL — AB (ref 13.0–18.0)
Lymphocyte #: 0.5 x10 3/mm — ABNORMAL LOW (ref 1.0–3.6)
Lymphocyte %: 4.4 %
MCH: 29.3 pg (ref 26.0–34.0)
MCHC: 32.3 g/dL (ref 32.0–36.0)
MCV: 91 fL (ref 80–100)
Monocyte #: 1.5 x10 3/mm — ABNORMAL HIGH (ref 0.2–1.0)
Monocyte %: 12.3 %
NEUTROS ABS: 9.6 x10 3/mm — AB (ref 1.4–6.5)
Neutrophil %: 78.6 %
Platelet: 363 x10 3/mm (ref 150–440)
RBC: 3.5 10*6/uL — ABNORMAL LOW (ref 4.40–5.90)
RDW: 18.7 % — ABNORMAL HIGH (ref 11.5–14.5)
WBC: 12.3 x10 3/mm — ABNORMAL HIGH (ref 3.8–10.6)

## 2013-04-09 LAB — CREATININE, SERUM
Creatinine: 0.82 mg/dL (ref 0.60–1.30)
EGFR (African American): >60

## 2013-04-11 LAB — URINE CULTURE

## 2013-04-16 LAB — CBC CANCER CENTER
Bands: 9 %
Comment - H1-Com5: NORMAL
EOS PCT: 4 %
HCT: 33.1 % — ABNORMAL LOW (ref 40.0–52.0)
HGB: 10.5 g/dL — AB (ref 13.0–18.0)
Lymphocytes: 1 %
MCH: 28.8 pg (ref 26.0–34.0)
MCHC: 31.5 g/dL — ABNORMAL LOW (ref 32.0–36.0)
MCV: 91 fL (ref 80–100)
Monocytes: 3 %
Platelet: 324 x10 3/mm (ref 150–440)
RBC: 3.63 10*6/uL — ABNORMAL LOW (ref 4.40–5.90)
RDW: 19.2 % — ABNORMAL HIGH (ref 11.5–14.5)
Segmented Neutrophils: 82 %
Variant Lymphocyte: 1 %
WBC: 46.6 x10 3/mm — ABNORMAL HIGH (ref 3.8–10.6)

## 2013-04-23 LAB — CBC CANCER CENTER
Basophil #: 0.2 x10 3/mm — ABNORMAL HIGH (ref 0.0–0.1)
Basophil %: 1.3 %
EOS ABS: 0.7 x10 3/mm (ref 0.0–0.7)
EOS PCT: 4.1 %
HCT: 32.9 % — ABNORMAL LOW (ref 40.0–52.0)
HGB: 10.4 g/dL — ABNORMAL LOW (ref 13.0–18.0)
LYMPHS ABS: 0.5 x10 3/mm — AB (ref 1.0–3.6)
Lymphocyte %: 3 %
MCH: 29 pg (ref 26.0–34.0)
MCHC: 31.5 g/dL — AB (ref 32.0–36.0)
MCV: 92 fL (ref 80–100)
MONOS PCT: 8.7 %
Monocyte #: 1.4 x10 3/mm — ABNORMAL HIGH (ref 0.2–1.0)
NEUTROS ABS: 13.2 x10 3/mm — AB (ref 1.4–6.5)
Neutrophil %: 82.9 %
Platelet: 276 x10 3/mm (ref 150–440)
RBC: 3.58 10*6/uL — ABNORMAL LOW (ref 4.40–5.90)
RDW: 19 % — ABNORMAL HIGH (ref 11.5–14.5)
WBC: 15.9 x10 3/mm — AB (ref 3.8–10.6)

## 2013-04-23 LAB — BASIC METABOLIC PANEL
ANION GAP: 7 (ref 7–16)
BUN: 15 mg/dL (ref 7–18)
CHLORIDE: 104 mmol/L (ref 98–107)
CREATININE: 0.89 mg/dL (ref 0.60–1.30)
Calcium, Total: 8.9 mg/dL (ref 8.5–10.1)
Co2: 30 mmol/L (ref 21–32)
EGFR (African American): >60
EGFR (Non-African Amer.): >60
Glucose: 108 mg/dL — ABNORMAL HIGH (ref 65–99)
OSMOLALITY: 283 (ref 275–301)
POTASSIUM: 4.2 mmol/L (ref 3.5–5.1)
Sodium: 141 mmol/L (ref 136–145)

## 2013-04-23 LAB — HEPATIC FUNCTION PANEL A (ARMC)
ALBUMIN: 3 g/dL — AB (ref 3.4–5.0)
ALT: 15 U/L (ref 12–78)
Alkaline Phosphatase: 132 U/L — ABNORMAL HIGH
BILIRUBIN TOTAL: 0.5 mg/dL (ref 0.2–1.0)
Bilirubin, Direct: 0.1 mg/dL (ref 0.00–0.20)
SGOT(AST): 15 U/L (ref 15–37)
TOTAL PROTEIN: 6.8 g/dL (ref 6.4–8.2)

## 2013-04-23 LAB — URIC ACID: Uric Acid: 5 mg/dL (ref 3.5–7.2)

## 2013-04-24 ENCOUNTER — Ambulatory Visit: Payer: Self-pay | Admitting: Internal Medicine

## 2013-04-30 LAB — CBC CANCER CENTER
Basophil #: 0.3 x10 3/mm — ABNORMAL HIGH (ref 0.0–0.1)
Basophil %: 2.6 %
EOS PCT: 5.6 %
Eosinophil #: 0.6 x10 3/mm (ref 0.0–0.7)
HCT: 31.6 % — ABNORMAL LOW (ref 40.0–52.0)
HGB: 10.1 g/dL — AB (ref 13.0–18.0)
LYMPHS ABS: 0.5 x10 3/mm — AB (ref 1.0–3.6)
Lymphocyte %: 5.2 %
MCH: 29.5 pg (ref 26.0–34.0)
MCHC: 32 g/dL (ref 32.0–36.0)
MCV: 92 fL (ref 80–100)
Monocyte #: 1.1 x10 3/mm — ABNORMAL HIGH (ref 0.2–1.0)
Monocyte %: 11.6 %
NEUTROS ABS: 7.4 x10 3/mm — AB (ref 1.4–6.5)
Neutrophil %: 75 %
PLATELETS: 293 x10 3/mm (ref 150–440)
RBC: 3.42 10*6/uL — AB (ref 4.40–5.90)
RDW: 18.2 % — ABNORMAL HIGH (ref 11.5–14.5)
WBC: 9.9 x10 3/mm (ref 3.8–10.6)

## 2013-05-24 ENCOUNTER — Ambulatory Visit: Payer: Self-pay | Admitting: Internal Medicine

## 2013-05-28 DIAGNOSIS — N481 Balanitis: Secondary | ICD-10-CM | POA: Insufficient documentation

## 2013-05-28 DIAGNOSIS — N3941 Urge incontinence: Secondary | ICD-10-CM | POA: Insufficient documentation

## 2013-05-28 DIAGNOSIS — R350 Frequency of micturition: Secondary | ICD-10-CM | POA: Insufficient documentation

## 2013-05-31 ENCOUNTER — Ambulatory Visit: Payer: Self-pay | Admitting: Family Medicine

## 2013-05-31 DIAGNOSIS — N400 Enlarged prostate without lower urinary tract symptoms: Secondary | ICD-10-CM | POA: Insufficient documentation

## 2013-05-31 DIAGNOSIS — C8590 Non-Hodgkin lymphoma, unspecified, unspecified site: Secondary | ICD-10-CM | POA: Insufficient documentation

## 2013-05-31 DIAGNOSIS — C8510 Unspecified B-cell lymphoma, unspecified site: Secondary | ICD-10-CM | POA: Insufficient documentation

## 2013-05-31 DIAGNOSIS — N401 Enlarged prostate with lower urinary tract symptoms: Secondary | ICD-10-CM

## 2013-05-31 DIAGNOSIS — E539 Vitamin B deficiency, unspecified: Secondary | ICD-10-CM | POA: Insufficient documentation

## 2013-05-31 DIAGNOSIS — N138 Other obstructive and reflux uropathy: Secondary | ICD-10-CM | POA: Insufficient documentation

## 2013-07-12 ENCOUNTER — Ambulatory Visit: Payer: Self-pay | Admitting: Internal Medicine

## 2013-07-15 ENCOUNTER — Ambulatory Visit: Payer: Self-pay | Admitting: Internal Medicine

## 2013-07-15 LAB — CBC CANCER CENTER
BASOS PCT: 2.4 %
Basophil #: 0.2 x10 3/mm — ABNORMAL HIGH (ref 0.0–0.1)
EOS PCT: 3.3 %
Eosinophil #: 0.3 x10 3/mm (ref 0.0–0.7)
HCT: 32.5 % — ABNORMAL LOW (ref 40.0–52.0)
HGB: 10.7 g/dL — ABNORMAL LOW (ref 13.0–18.0)
LYMPHS PCT: 9.7 %
Lymphocyte #: 0.9 x10 3/mm — ABNORMAL LOW (ref 1.0–3.6)
MCH: 29.9 pg (ref 26.0–34.0)
MCHC: 32.9 g/dL (ref 32.0–36.0)
MCV: 91 fL (ref 80–100)
MONO ABS: 1.5 x10 3/mm — AB (ref 0.2–1.0)
Monocyte %: 16.2 %
NEUTROS ABS: 6.2 x10 3/mm (ref 1.4–6.5)
Neutrophil %: 68.4 %
Platelet: 335 x10 3/mm (ref 150–440)
RBC: 3.59 10*6/uL — ABNORMAL LOW (ref 4.40–5.90)
RDW: 15.3 % — ABNORMAL HIGH (ref 11.5–14.5)
WBC: 9 x10 3/mm (ref 3.8–10.6)

## 2013-07-15 LAB — HEPATIC FUNCTION PANEL A (ARMC)
ALBUMIN: 3.4 g/dL (ref 3.4–5.0)
Alkaline Phosphatase: 79 U/L
Bilirubin, Direct: 0.1 mg/dL (ref 0.00–0.20)
Bilirubin,Total: 0.5 mg/dL (ref 0.2–1.0)
SGOT(AST): 8 U/L — ABNORMAL LOW (ref 15–37)
SGPT (ALT): 13 U/L (ref 12–78)
TOTAL PROTEIN: 7.8 g/dL (ref 6.4–8.2)

## 2013-07-15 LAB — LACTATE DEHYDROGENASE: LDH: 89 U/L (ref 85–241)

## 2013-07-15 LAB — CREATININE, SERUM
Creatinine: 0.82 mg/dL (ref 0.60–1.30)
EGFR (African American): >60

## 2013-07-17 ENCOUNTER — Telehealth: Payer: Self-pay | Admitting: Gastroenterology

## 2013-07-17 NOTE — Telephone Encounter (Signed)
Spoke with Judeen Hammans and told her Dr. Sharlett Iles has retired. Offered extender OV on Friday. She will call patient and see what he wants to do then call me back. She will fax all records she has at [facility].

## 2013-07-17 NOTE — Telephone Encounter (Signed)
Received CT and Dr. Carlean Purl reviewed it. He will review the patient's chart and decide if patient needs MRI then OV. We will arrange with patient. Conrad notified.

## 2013-07-18 ENCOUNTER — Telehealth: Payer: Self-pay

## 2013-07-18 NOTE — Telephone Encounter (Signed)
I spoke with the patient and explained the recommendation of MRI.  Patient became upset and stated that "you all are just trying to make money". I explained that his oncologist has forwarded on the information and ask that we see the patient in follow up.   He said he discussed with his oncologist and declined MRI at his office visit with him.  He refused to schedule MRI or an appt with a MD/APP here to further discuss.  He is asked to go back and discuss with his concerns with his oncologist and call back if he changes his mind

## 2013-07-18 NOTE — Telephone Encounter (Signed)
Message copied by Marlon Pel on Thu Jul 18, 2013  9:21 AM ------      Message from: Silvano Rusk E      Created: Thu Jul 18, 2013  7:53 AM      Regarding: needs MRI       Needs an MR abdomen without and with IV contrast to evaluate abnormal right lobe of liver on CT at [facility]            See if he had bun/creat at [facility] and get those labs and any other            If not then do that to see what renal fx is and order MR            Tell him I will contact him with MR results after            I do not think he needs an appt w/ [place] to set this up - we can schedule me/APP if he disagrees            He was a DRP patient so will be mine now             ------

## 2013-07-18 NOTE — Telephone Encounter (Signed)
[facility] faxed labs Left message for patient to call back

## 2013-07-21 ENCOUNTER — Encounter: Payer: Self-pay | Admitting: Internal Medicine

## 2013-07-24 ENCOUNTER — Ambulatory Visit: Payer: Self-pay | Admitting: Internal Medicine

## 2013-07-25 ENCOUNTER — Inpatient Hospital Stay: Payer: Self-pay | Admitting: Surgery

## 2013-07-25 LAB — CBC WITH DIFFERENTIAL/PLATELET
BASOS PCT: 1.1 %
Basophil #: 0.2 10*3/uL — ABNORMAL HIGH (ref 0.0–0.1)
Basophil #: 0.1 10*3/uL (ref 0.0–0.1)
Basophil %: 1.2 %
EOS PCT: 1 %
Eosinophil #: 0.1 10*3/uL (ref 0.0–0.7)
Eosinophil #: 0.1 10*3/uL (ref 0.0–0.7)
Eosinophil %: 0.7 %
HCT: 32.4 % — ABNORMAL LOW (ref 40.0–52.0)
HCT: 32.4 % — AB (ref 40.0–52.0)
HGB: 10.7 g/dL — ABNORMAL LOW (ref 13.0–18.0)
HGB: 10.4 g/dL — ABNORMAL LOW (ref 13.0–18.0)
LYMPHS ABS: 1.1 10*3/uL (ref 1.0–3.6)
LYMPHS PCT: 10.3 %
Lymphocyte #: 0.7 10*3/uL — ABNORMAL LOW (ref 1.0–3.6)
Lymphocyte %: 5 %
MCH: 29.4 pg (ref 26.0–34.0)
MCH: 28.9 pg (ref 26.0–34.0)
MCHC: 32.9 g/dL (ref 32.0–36.0)
MCHC: 32 g/dL (ref 32.0–36.0)
MCV: 89 fL (ref 80–100)
MCV: 90 fL (ref 80–100)
MONO ABS: 1.7 x10 3/mm — AB (ref 0.2–1.0)
MONO ABS: 1.4 x10 3/mm — AB (ref 0.2–1.0)
Monocyte %: 11.6 %
Monocyte %: 13.3 %
Neutrophil #: 11.8 10*3/uL — ABNORMAL HIGH (ref 1.4–6.5)
Neutrophil #: 8.1 10*3/uL — ABNORMAL HIGH (ref 1.4–6.5)
Neutrophil %: 81.3 %
Neutrophil %: 74.5 %
PLATELETS: 331 10*3/uL (ref 150–440)
Platelet: 308 10*3/uL (ref 150–440)
RBC: 3.63 10*6/uL — ABNORMAL LOW (ref 4.40–5.90)
RBC: 3.58 10*6/uL — ABNORMAL LOW (ref 4.40–5.90)
RDW: 15.4 % — ABNORMAL HIGH (ref 11.5–14.5)
RDW: 15.4 % — AB (ref 11.5–14.5)
WBC: 14.6 10*3/uL — ABNORMAL HIGH (ref 3.8–10.6)
WBC: 10.9 10*3/uL — ABNORMAL HIGH (ref 3.8–10.6)

## 2013-07-25 LAB — URINALYSIS, COMPLETE
BILIRUBIN, UR: NEGATIVE
Bacteria: NONE SEEN
Blood: NEGATIVE
Glucose,UR: NEGATIVE mg/dL (ref 0–75)
Hyaline Cast: 2
KETONE: NEGATIVE
LEUKOCYTE ESTERASE: NEGATIVE
Nitrite: NEGATIVE
PH: 6 (ref 4.5–8.0)
Protein: 30
SQUAMOUS EPITHELIAL: NONE SEEN
Specific Gravity: 1.016 (ref 1.003–1.030)
WBC UR: 1 /HPF (ref 0–5)

## 2013-07-25 LAB — COMPREHENSIVE METABOLIC PANEL
ALK PHOS: 132 U/L — AB
ALT: 48 U/L (ref 12–78)
ALT: 135 U/L — AB (ref 12–78)
ANION GAP: 6 — AB (ref 7–16)
ANION GAP: 6 — AB (ref 7–16)
Albumin: 3 g/dL — ABNORMAL LOW (ref 3.4–5.0)
Albumin: 2.8 g/dL — ABNORMAL LOW (ref 3.4–5.0)
Alkaline Phosphatase: 160 U/L — ABNORMAL HIGH
BILIRUBIN TOTAL: 0.9 mg/dL (ref 0.2–1.0)
BILIRUBIN TOTAL: 1.7 mg/dL — AB (ref 0.2–1.0)
BUN: 12 mg/dL (ref 7–18)
BUN: 11 mg/dL (ref 7–18)
CALCIUM: 9.2 mg/dL (ref 8.5–10.1)
CO2: 29 mmol/L (ref 21–32)
CREATININE: 0.72 mg/dL (ref 0.60–1.30)
Calcium, Total: 9 mg/dL (ref 8.5–10.1)
Chloride: 102 mmol/L (ref 98–107)
Chloride: 103 mmol/L (ref 98–107)
Co2: 30 mmol/L (ref 21–32)
Creatinine: 0.9 mg/dL (ref 0.60–1.30)
EGFR (African American): >60
EGFR (African American): >60
EGFR (Non-African Amer.): >60
EGFR (Non-African Amer.): >60
GLUCOSE: 142 mg/dL — AB (ref 65–99)
Glucose: 112 mg/dL — ABNORMAL HIGH (ref 65–99)
OSMOLALITY: 278 (ref 275–301)
Osmolality: 276 (ref 275–301)
Potassium: 3.9 mmol/L (ref 3.5–5.1)
Potassium: 3.6 mmol/L (ref 3.5–5.1)
SGOT(AST): 99 U/L — ABNORMAL HIGH (ref 15–37)
SGOT(AST): 209 U/L — ABNORMAL HIGH (ref 15–37)
Sodium: 138 mmol/L (ref 136–145)
Sodium: 138 mmol/L (ref 136–145)
TOTAL PROTEIN: 7.3 g/dL (ref 6.4–8.2)
Total Protein: 7 g/dL (ref 6.4–8.2)

## 2013-07-25 LAB — CK TOTAL AND CKMB (NOT AT ARMC)
CK, TOTAL: 23 U/L — AB
CK-MB: 0.7 ng/mL (ref 0.5–3.6)

## 2013-07-25 LAB — LIPASE, BLOOD: Lipase: 89 U/L (ref 73–393)

## 2013-07-25 LAB — TROPONIN I: Troponin-I: 0.03 ng/mL

## 2013-07-27 LAB — COMPREHENSIVE METABOLIC PANEL
Albumin: 2.7 g/dL — ABNORMAL LOW (ref 3.4–5.0)
Alkaline Phosphatase: 126 U/L — ABNORMAL HIGH
Anion Gap: 6 — ABNORMAL LOW (ref 7–16)
BILIRUBIN TOTAL: 0.5 mg/dL (ref 0.2–1.0)
BUN: 16 mg/dL (ref 7–18)
CALCIUM: 8.8 mg/dL (ref 8.5–10.1)
CO2: 28 mmol/L (ref 21–32)
CREATININE: 1.03 mg/dL (ref 0.60–1.30)
Chloride: 105 mmol/L (ref 98–107)
EGFR (African American): >60
EGFR (Non-African Amer.): >60
Glucose: 137 mg/dL — ABNORMAL HIGH (ref 65–99)
Osmolality: 281 (ref 275–301)
Potassium: 4.2 mmol/L (ref 3.5–5.1)
SGOT(AST): 41 U/L — ABNORMAL HIGH (ref 15–37)
SGPT (ALT): 68 U/L (ref 12–78)
Sodium: 139 mmol/L (ref 136–145)
TOTAL PROTEIN: 6.7 g/dL (ref 6.4–8.2)

## 2013-07-27 LAB — CBC WITH DIFFERENTIAL/PLATELET
BASOS ABS: 0.1 10*3/uL (ref 0.0–0.1)
Basophil %: 0.5 %
EOS PCT: 0 %
Eosinophil #: 0 10*3/uL (ref 0.0–0.7)
HCT: 28.9 % — AB (ref 40.0–52.0)
HGB: 10 g/dL — ABNORMAL LOW (ref 13.0–18.0)
Lymphocyte #: 0.8 10*3/uL — ABNORMAL LOW (ref 1.0–3.6)
Lymphocyte %: 4.9 %
MCH: 31.1 pg (ref 26.0–34.0)
MCHC: 34.6 g/dL (ref 32.0–36.0)
MCV: 90 fL (ref 80–100)
Monocyte #: 1.8 x10 3/mm — ABNORMAL HIGH (ref 0.2–1.0)
Monocyte %: 10.6 %
NEUTROS ABS: 14 10*3/uL — AB (ref 1.4–6.5)
NEUTROS PCT: 84 %
Platelet: 365 10*3/uL (ref 150–440)
RBC: 3.21 10*6/uL — ABNORMAL LOW (ref 4.40–5.90)
RDW: 15.5 % — ABNORMAL HIGH (ref 11.5–14.5)
WBC: 16.7 10*3/uL — AB (ref 3.8–10.6)

## 2013-07-28 LAB — URINALYSIS, COMPLETE
BACTERIA: NEGATIVE
BILIRUBIN, UR: NEGATIVE
BLOOD: NEGATIVE
Glucose,UR: NEGATIVE mg/dL (ref 0–75)
Ketone: NEGATIVE
LEUKOCYTE ESTERASE: NEGATIVE
Nitrite: NEGATIVE
Ph: 5 (ref 4.5–8.0)
Protein: NEGATIVE
Specific Gravity: 1.017 (ref 1.003–1.030)

## 2013-07-28 LAB — CBC
HCT: 25.7 % — ABNORMAL LOW (ref 40.0–52.0)
HGB: 8.3 g/dL — ABNORMAL LOW (ref 13.0–18.0)
MCH: 29.1 pg (ref 26.0–34.0)
MCHC: 32.5 g/dL (ref 32.0–36.0)
MCV: 90 fL (ref 80–100)
PLATELETS: 330 10*3/uL (ref 150–440)
RBC: 2.86 10*6/uL — ABNORMAL LOW (ref 4.40–5.90)
RDW: 15.6 % — ABNORMAL HIGH (ref 11.5–14.5)
WBC: 12.7 10*3/uL — AB (ref 3.8–10.6)

## 2013-07-29 ENCOUNTER — Inpatient Hospital Stay: Payer: Self-pay | Admitting: Surgery

## 2013-07-29 LAB — CBC WITH DIFFERENTIAL/PLATELET
Basophil #: 0.1 10*3/uL (ref 0.0–0.1)
Basophil %: 1.2 %
Eosinophil #: 0.2 10*3/uL (ref 0.0–0.7)
Eosinophil %: 1.8 %
HCT: 24 % — ABNORMAL LOW (ref 40.0–52.0)
HGB: 7.9 g/dL — ABNORMAL LOW (ref 13.0–18.0)
Lymphocyte #: 0.7 10*3/uL — ABNORMAL LOW (ref 1.0–3.6)
Lymphocyte %: 7.4 %
MCH: 29.6 pg (ref 26.0–34.0)
MCHC: 33 g/dL (ref 32.0–36.0)
MCV: 90 fL (ref 80–100)
Monocyte #: 0.9 x10 3/mm (ref 0.2–1.0)
Monocyte %: 9.3 %
NEUTROS ABS: 7.7 10*3/uL — AB (ref 1.4–6.5)
NEUTROS PCT: 80.3 %
PLATELETS: 288 10*3/uL (ref 150–440)
RBC: 2.67 10*6/uL — ABNORMAL LOW (ref 4.40–5.90)
RDW: 16 % — AB (ref 11.5–14.5)
WBC: 9.6 10*3/uL (ref 3.8–10.6)

## 2013-07-29 LAB — COMPREHENSIVE METABOLIC PANEL
ALT: 34 U/L (ref 12–78)
ANION GAP: 6 — AB (ref 7–16)
Albumin: 2.9 g/dL — ABNORMAL LOW (ref 3.4–5.0)
Alkaline Phosphatase: 99 U/L
BUN: 18 mg/dL (ref 7–18)
Bilirubin,Total: 0.6 mg/dL (ref 0.2–1.0)
CHLORIDE: 101 mmol/L (ref 98–107)
CREATININE: 0.88 mg/dL (ref 0.60–1.30)
Calcium, Total: 8.8 mg/dL (ref 8.5–10.1)
Co2: 28 mmol/L (ref 21–32)
EGFR (Non-African Amer.): >60
Glucose: 106 mg/dL — ABNORMAL HIGH (ref 65–99)
Osmolality: 272 (ref 275–301)
POTASSIUM: 3.8 mmol/L (ref 3.5–5.1)
SGOT(AST): 15 U/L (ref 15–37)
SODIUM: 135 mmol/L — AB (ref 136–145)
TOTAL PROTEIN: 6.9 g/dL (ref 6.4–8.2)

## 2013-07-29 LAB — CBC
HCT: 21.9 % — AB (ref 40.0–52.0)
HGB: 7.3 g/dL — ABNORMAL LOW (ref 13.0–18.0)
MCH: 29.9 pg (ref 26.0–34.0)
MCHC: 33.5 g/dL (ref 32.0–36.0)
MCV: 89 fL (ref 80–100)
PLATELETS: 289 10*3/uL (ref 150–440)
RBC: 2.45 10*6/uL — ABNORMAL LOW (ref 4.40–5.90)
RDW: 15.8 % — ABNORMAL HIGH (ref 11.5–14.5)
WBC: 10.7 10*3/uL — ABNORMAL HIGH (ref 3.8–10.6)

## 2013-07-29 LAB — LIPASE, BLOOD: LIPASE: 139 U/L (ref 73–393)

## 2013-07-29 LAB — PROTIME-INR
INR: 1
PROTHROMBIN TIME: 13.1 s (ref 11.5–14.7)

## 2013-07-29 LAB — TROPONIN I: Troponin-I: <0.02 ng/mL

## 2013-07-31 LAB — CBC WITH DIFFERENTIAL/PLATELET
BASOS ABS: 0.1 10*3/uL (ref 0.0–0.1)
BASOS PCT: 0.6 %
Eosinophil #: 0.4 10*3/uL (ref 0.0–0.7)
Eosinophil %: 3 %
HCT: 23.6 % — AB (ref 40.0–52.0)
HGB: 7.6 g/dL — ABNORMAL LOW (ref 13.0–18.0)
LYMPHS ABS: 1.2 10*3/uL (ref 1.0–3.6)
Lymphocyte %: 9.2 %
MCH: 29.1 pg (ref 26.0–34.0)
MCHC: 32.4 g/dL (ref 32.0–36.0)
MCV: 90 fL (ref 80–100)
MONO ABS: 1.6 x10 3/mm — AB (ref 0.2–1.0)
Monocyte %: 12.3 %
NEUTROS ABS: 9.9 10*3/uL — AB (ref 1.4–6.5)
NEUTROS PCT: 74.9 %
PLATELETS: 399 10*3/uL (ref 150–440)
RBC: 2.62 10*6/uL — AB (ref 4.40–5.90)
RDW: 15.9 % — AB (ref 11.5–14.5)
WBC: 13.3 10*3/uL — ABNORMAL HIGH (ref 3.8–10.6)

## 2013-07-31 LAB — COMPREHENSIVE METABOLIC PANEL
ALT: 24 U/L (ref 12–78)
ANION GAP: 9 (ref 7–16)
Albumin: 2.8 g/dL — ABNORMAL LOW (ref 3.4–5.0)
Alkaline Phosphatase: 89 U/L
BILIRUBIN TOTAL: 0.8 mg/dL (ref 0.2–1.0)
BUN: 9 mg/dL (ref 7–18)
CHLORIDE: 100 mmol/L (ref 98–107)
Calcium, Total: 8.9 mg/dL (ref 8.5–10.1)
Co2: 27 mmol/L (ref 21–32)
Creatinine: 0.87 mg/dL (ref 0.60–1.30)
EGFR (African American): >60
EGFR (Non-African Amer.): >60
Glucose: 120 mg/dL — ABNORMAL HIGH (ref 65–99)
Osmolality: 272 (ref 275–301)
POTASSIUM: 3.9 mmol/L (ref 3.5–5.1)
SGOT(AST): 14 U/L — ABNORMAL LOW (ref 15–37)
Sodium: 136 mmol/L (ref 136–145)
Total Protein: 7 g/dL (ref 6.4–8.2)

## 2013-07-31 LAB — PATHOLOGY REPORT

## 2013-07-31 LAB — LIPASE, BLOOD: Lipase: 64 U/L — ABNORMAL LOW (ref 73–393)

## 2013-08-01 ENCOUNTER — Inpatient Hospital Stay: Payer: Self-pay | Admitting: Surgery

## 2013-08-01 ENCOUNTER — Encounter: Payer: Self-pay | Admitting: Internal Medicine

## 2013-08-02 LAB — CBC WITH DIFFERENTIAL/PLATELET
BASOS PCT: 1.9 %
Basophil #: 0.2 10*3/uL — ABNORMAL HIGH (ref 0.0–0.1)
Eosinophil #: 0.5 10*3/uL (ref 0.0–0.7)
Eosinophil %: 5.9 %
HCT: 18.2 % — ABNORMAL LOW (ref 40.0–52.0)
HGB: 6 g/dL — AB (ref 13.0–18.0)
Lymphocyte #: 0.6 10*3/uL — ABNORMAL LOW (ref 1.0–3.6)
Lymphocyte %: 8 %
MCH: 30.4 pg (ref 26.0–34.0)
MCHC: 33.1 g/dL (ref 32.0–36.0)
MCV: 92 fL (ref 80–100)
MONO ABS: 1.4 x10 3/mm — AB (ref 0.2–1.0)
Monocyte %: 17.9 %
Neutrophil #: 5.3 10*3/uL (ref 1.4–6.5)
Neutrophil %: 66.3 %
Platelet: 330 10*3/uL (ref 150–440)
RBC: 1.98 10*6/uL — ABNORMAL LOW (ref 4.40–5.90)
RDW: 15.8 % — AB (ref 11.5–14.5)
WBC: 8.1 10*3/uL (ref 3.8–10.6)

## 2013-08-02 LAB — COMPREHENSIVE METABOLIC PANEL
ANION GAP: 5 — AB (ref 7–16)
Albumin: 2.2 g/dL — ABNORMAL LOW (ref 3.4–5.0)
Alkaline Phosphatase: 67 U/L
BILIRUBIN TOTAL: 0.7 mg/dL (ref 0.2–1.0)
BUN: 8 mg/dL (ref 7–18)
CO2: 29 mmol/L (ref 21–32)
Calcium, Total: 7.9 mg/dL — ABNORMAL LOW (ref 8.5–10.1)
Chloride: 105 mmol/L (ref 98–107)
Creatinine: 0.82 mg/dL (ref 0.60–1.30)
EGFR (African American): >60
EGFR (Non-African Amer.): >60
Glucose: 108 mg/dL — ABNORMAL HIGH (ref 65–99)
OSMOLALITY: 276 (ref 275–301)
Potassium: 3.8 mmol/L (ref 3.5–5.1)
SGOT(AST): 8 U/L — ABNORMAL LOW (ref 15–37)
SGPT (ALT): 13 U/L (ref 12–78)
Sodium: 139 mmol/L (ref 136–145)
TOTAL PROTEIN: 5.4 g/dL — AB (ref 6.4–8.2)

## 2013-08-02 LAB — MAGNESIUM: Magnesium: 1.4 mg/dL — ABNORMAL LOW

## 2013-08-02 LAB — LIPASE, BLOOD: LIPASE: 48 U/L — AB (ref 73–393)

## 2013-08-02 LAB — AMYLASE: Amylase: 14 U/L — ABNORMAL LOW (ref 25–115)

## 2013-08-03 LAB — CBC WITH DIFFERENTIAL/PLATELET
BASOS ABS: 0.1 10*3/uL (ref 0.0–0.1)
BASOS PCT: 1.5 %
EOS ABS: 0.4 10*3/uL (ref 0.0–0.7)
Eosinophil %: 4.6 %
HCT: 23.3 % — ABNORMAL LOW (ref 40.0–52.0)
HGB: 7.8 g/dL — ABNORMAL LOW (ref 13.0–18.0)
Lymphocyte #: 0.7 10*3/uL — ABNORMAL LOW (ref 1.0–3.6)
Lymphocyte %: 7.4 %
MCH: 29.8 pg (ref 26.0–34.0)
MCHC: 33.4 g/dL (ref 32.0–36.0)
MCV: 89 fL (ref 80–100)
Monocyte #: 1.6 x10 3/mm — ABNORMAL HIGH (ref 0.2–1.0)
Monocyte %: 16.5 %
NEUTROS PCT: 70 %
Neutrophil #: 6.6 10*3/uL — ABNORMAL HIGH (ref 1.4–6.5)
PLATELETS: 370 10*3/uL (ref 150–440)
RBC: 2.61 10*6/uL — ABNORMAL LOW (ref 4.40–5.90)
RDW: 15.7 % — ABNORMAL HIGH (ref 11.5–14.5)
WBC: 9.5 10*3/uL (ref 3.8–10.6)

## 2013-08-04 ENCOUNTER — Ambulatory Visit: Payer: Self-pay | Admitting: Internal Medicine

## 2013-12-23 ENCOUNTER — Ambulatory Visit: Payer: Self-pay | Admitting: Internal Medicine

## 2013-12-23 LAB — CREATININE, SERUM
CREATININE: 0.89 mg/dL (ref 0.60–1.30)
EGFR (Non-African Amer.): >60

## 2013-12-23 LAB — CBC CANCER CENTER
BASOS ABS: 0.3 x10 3/mm — AB (ref 0.0–0.1)
Basophil %: 2.7 %
EOS ABS: 0.3 x10 3/mm (ref 0.0–0.7)
Eosinophil %: 3 %
HCT: 28.1 % — AB (ref 40.0–52.0)
HGB: 8.9 g/dL — ABNORMAL LOW (ref 13.0–18.0)
LYMPHS PCT: 8.3 %
Lymphocyte #: 0.8 x10 3/mm — ABNORMAL LOW (ref 1.0–3.6)
MCH: 26.2 pg (ref 26.0–34.0)
MCHC: 31.6 g/dL — ABNORMAL LOW (ref 32.0–36.0)
MCV: 83 fL (ref 80–100)
MONO ABS: 1.4 x10 3/mm — AB (ref 0.2–1.0)
Monocyte %: 14.3 %
NEUTROS ABS: 7.1 x10 3/mm — AB (ref 1.4–6.5)
Neutrophil %: 71.7 %
PLATELETS: 505 x10 3/mm — AB (ref 150–440)
RBC: 3.38 10*6/uL — AB (ref 4.40–5.90)
RDW: 19.5 % — AB (ref 11.5–14.5)
WBC: 9.9 x10 3/mm (ref 3.8–10.6)

## 2013-12-23 LAB — HEPATIC FUNCTION PANEL A (ARMC)
ALK PHOS: 95 U/L
ALT: 12 U/L — AB
Albumin: 2.6 g/dL — ABNORMAL LOW (ref 3.4–5.0)
Bilirubin, Direct: 0.2 mg/dL (ref 0.0–0.2)
Bilirubin,Total: 0.5 mg/dL (ref 0.2–1.0)
SGOT(AST): 12 U/L — ABNORMAL LOW (ref 15–37)
TOTAL PROTEIN: 7.4 g/dL (ref 6.4–8.2)

## 2013-12-23 LAB — LACTATE DEHYDROGENASE: LDH: 90 U/L (ref 85–241)

## 2013-12-24 ENCOUNTER — Ambulatory Visit: Payer: Self-pay | Admitting: Internal Medicine

## 2013-12-25 LAB — FERRITIN: FERRITIN (ARMC): 280 ng/mL (ref 8–388)

## 2014-01-24 ENCOUNTER — Ambulatory Visit: Payer: Self-pay | Admitting: Internal Medicine

## 2014-02-04 LAB — CBC CANCER CENTER
BANDS NEUTROPHIL: 2 %
Basophil: 4 %
Eosinophil: 3 %
HCT: 26.4 % — ABNORMAL LOW (ref 40.0–52.0)
HGB: 8.4 g/dL — ABNORMAL LOW (ref 13.0–18.0)
Lymphocytes: 5 %
MCH: 25.5 pg — ABNORMAL LOW (ref 26.0–34.0)
MCHC: 31.8 g/dL — ABNORMAL LOW (ref 32.0–36.0)
MCV: 80 fL (ref 80–100)
Monocytes: 9 %
PLATELETS: 459 x10 3/mm — AB (ref 150–440)
RBC: 3.28 10*6/uL — ABNORMAL LOW (ref 4.40–5.90)
RDW: 19.8 % — AB (ref 11.5–14.5)
SEGMENTED NEUTROPHILS: 77 %
WBC: 8.1 x10 3/mm (ref 3.8–10.6)

## 2014-02-04 LAB — FOLATE: Folic Acid: 19.3 ng/mL — ABNORMAL HIGH (ref 3.1–17.5)

## 2014-02-04 LAB — TSH: THYROID STIMULATING HORM: 2.26 u[IU]/mL

## 2014-02-04 LAB — IRON AND TIBC
IRON SATURATION: 15 %
IRON: 22 ug/dL — AB (ref 65–175)
Iron Bind.Cap.(Total): 150 ug/dL — ABNORMAL LOW (ref 250–450)
UNBOUND IRON-BIND. CAP.: 128 ug/dL

## 2014-02-04 LAB — RETICULOCYTES
Absolute Retic Count: 0.0515 10*6/uL (ref 0.019–0.186)
RETICULOCYTE: 1.57 % (ref 0.4–3.1)

## 2014-02-05 LAB — FERRITIN: Ferritin (ARMC): 421 ng/mL — ABNORMAL HIGH (ref 8–388)

## 2014-02-17 LAB — OCCULT BLOOD X 1 CARD TO LAB, STOOL
OCCULT BLOOD, FECES: NEGATIVE
Occult Blood, Feces: NEGATIVE

## 2014-02-24 ENCOUNTER — Ambulatory Visit: Payer: Self-pay | Admitting: Vascular Surgery

## 2014-02-24 ENCOUNTER — Ambulatory Visit: Payer: Self-pay | Admitting: Internal Medicine

## 2014-02-26 LAB — BASIC METABOLIC PANEL
Anion Gap: 8 (ref 7–16)
BUN: 13 mg/dL (ref 7–18)
CREATININE: 0.9 mg/dL (ref 0.60–1.30)
Calcium, Total: 8.5 mg/dL (ref 8.5–10.1)
Chloride: 99 mmol/L (ref 98–107)
Co2: 29 mmol/L (ref 21–32)
EGFR (Non-African Amer.): >60
GLUCOSE: 205 mg/dL — AB (ref 65–99)
Osmolality: 278 (ref 275–301)
POTASSIUM: 3.9 mmol/L (ref 3.5–5.1)
SODIUM: 136 mmol/L (ref 136–145)

## 2014-02-26 LAB — CBC CANCER CENTER
Basophil #: 0.3 x10 3/mm — ABNORMAL HIGH (ref 0.0–0.1)
Basophil %: 2.4 %
EOS PCT: 2 %
Eosinophil #: 0.2 x10 3/mm (ref 0.0–0.7)
HCT: 27.9 % — ABNORMAL LOW (ref 40.0–52.0)
HGB: 8.9 g/dL — ABNORMAL LOW (ref 13.0–18.0)
Lymphocyte #: 0.7 x10 3/mm — ABNORMAL LOW (ref 1.0–3.6)
Lymphocyte %: 6.8 %
MCH: 26.4 pg (ref 26.0–34.0)
MCHC: 31.9 g/dL — ABNORMAL LOW (ref 32.0–36.0)
MCV: 83 fL (ref 80–100)
MONO ABS: 1.3 x10 3/mm — AB (ref 0.2–1.0)
Monocyte %: 11.8 %
Neutrophil #: 8.5 x10 3/mm — ABNORMAL HIGH (ref 1.4–6.5)
Neutrophil %: 77 %
Platelet: 405 x10 3/mm (ref 150–440)
RBC: 3.36 10*6/uL — AB (ref 4.40–5.90)
RDW: 24.5 % — AB (ref 11.5–14.5)
WBC: 11 x10 3/mm — ABNORMAL HIGH (ref 3.8–10.6)

## 2014-02-26 LAB — HEPATIC FUNCTION PANEL A (ARMC)
Albumin: 2.4 g/dL — ABNORMAL LOW (ref 3.4–5.0)
Alkaline Phosphatase: 118 U/L — ABNORMAL HIGH (ref 46–116)
BILIRUBIN DIRECT: 0.2 mg/dL (ref 0.0–0.2)
BILIRUBIN TOTAL: 0.5 mg/dL (ref 0.2–1.0)
SGOT(AST): 10 U/L — ABNORMAL LOW (ref 15–37)
SGPT (ALT): 14 U/L (ref 14–63)
Total Protein: 7.2 g/dL (ref 6.4–8.2)

## 2014-02-26 LAB — LACTATE DEHYDROGENASE: LDH: 77 U/L — ABNORMAL LOW (ref 85–241)

## 2014-03-25 ENCOUNTER — Ambulatory Visit
Admit: 2014-03-25 | Disposition: A | Discharge status: Home / Self Care | Payer: Self-pay | Attending: Internal Medicine | Admitting: Internal Medicine

## 2014-04-23 LAB — CBC CANCER CENTER
Basophil #: 0.5 x10 3/mm — ABNORMAL HIGH (ref 0.0–0.1)
Basophil %: 4.1 %
EOS PCT: 11 %
Eosinophil #: 1.4 x10 3/mm — ABNORMAL HIGH (ref 0.0–0.7)
HCT: 26.9 % — AB (ref 40.0–52.0)
HGB: 8.7 g/dL — AB (ref 13.0–18.0)
LYMPHS ABS: 0.8 x10 3/mm — AB (ref 1.0–3.6)
Lymphocyte %: 6.5 %
MCH: 28.1 pg (ref 26.0–34.0)
MCHC: 32.2 g/dL (ref 32.0–36.0)
MCV: 87 fL (ref 80–100)
MONOS PCT: 12.3 %
Monocyte #: 1.6 x10 3/mm — ABNORMAL HIGH (ref 0.2–1.0)
NEUTROS ABS: 8.6 x10 3/mm — AB (ref 1.4–6.5)
Neutrophil %: 66.1 %
Platelet: 406 x10 3/mm (ref 150–440)
RBC: 3.08 10*6/uL — AB (ref 4.40–5.90)
RDW: 23.7 % — ABNORMAL HIGH (ref 11.5–14.5)
WBC: 13.1 x10 3/mm — AB (ref 3.8–10.6)

## 2014-04-25 ENCOUNTER — Ambulatory Visit
Admit: 2014-04-25 | Disposition: A | Discharge status: Home / Self Care | Payer: Self-pay | Attending: Internal Medicine | Admitting: Internal Medicine

## 2014-05-07 LAB — CBC CANCER CENTER
BASOS PCT: 3.9 %
Basophil #: 0.3 x10 3/mm — ABNORMAL HIGH (ref 0.0–0.1)
EOS PCT: 17.8 %
Eosinophil #: 1.6 x10 3/mm — ABNORMAL HIGH (ref 0.0–0.7)
HCT: 24.5 % — ABNORMAL LOW (ref 40.0–52.0)
HGB: 7.8 g/dL — AB (ref 13.0–18.0)
Lymphocyte #: 0.8 x10 3/mm — ABNORMAL LOW (ref 1.0–3.6)
Lymphocyte %: 8.6 %
MCH: 27.7 pg (ref 26.0–34.0)
MCHC: 32 g/dL (ref 32.0–36.0)
MCV: 87 fL (ref 80–100)
MONO ABS: 1.4 x10 3/mm — AB (ref 0.2–1.0)
Monocyte %: 16.5 %
NEUTROS PCT: 53.2 %
Neutrophil #: 4.6 x10 3/mm (ref 1.4–6.5)
Platelet: 455 x10 3/mm — ABNORMAL HIGH (ref 150–440)
RBC: 2.83 10*6/uL — AB (ref 4.40–5.90)
RDW: 20 % — ABNORMAL HIGH (ref 11.5–14.5)
WBC: 8.7 x10 3/mm (ref 3.8–10.6)

## 2014-05-07 LAB — IRON AND TIBC
Iron Bind.Cap.(Total): 146 — ABNORMAL LOW (ref 250–450)
Iron Saturation: 6.3
Iron: 10 ug/dL — ABNORMAL LOW
Unbound Iron-Bind.Cap.: 134.6

## 2014-05-07 LAB — LACTATE DEHYDROGENASE: LDH: 81 U/L — ABNORMAL LOW

## 2014-05-07 LAB — CREATININE, SERUM
Creatinine: 0.81 mg/dL
EGFR (African American): >60
EGFR (Non-African Amer.): >60

## 2014-05-07 LAB — FERRITIN: Ferritin (ARMC): 890 ng/mL — ABNORMAL HIGH

## 2014-05-08 ENCOUNTER — Encounter: Payer: Self-pay | Admitting: Internal Medicine

## 2014-05-09 LAB — POTASSIUM: Potassium: 3.6 mmol/L

## 2014-05-09 LAB — MAGNESIUM: MAGNESIUM: 1.9 mg/dL

## 2014-05-17 NOTE — Consult Note (Signed)
PATIENT NAME:  [name], [name] MR#:  [id number] DATE OF BIRTH:  02-14-1932  DATE OF CONSULTATION:  07/25/2013  REFERRING PHYSICIAN:   CONSULTING PHYSICIAN:  Theodoro Grist, MD  PRIMARY CARE PHYSICIAN: Dr. 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 According to the patient, he was doing well up until last night when he started having significant pain in abdomen, which was 8 to 10/10 by intensity, right-sided, after he ate yesterday. Pain is worse in periumbilical, supraumbilical area, on the right side, and felt indigestion. The patient had nausea and episodes of vomiting and it relieved his pain somewhat. This morning he again ate his cereal and 30 minutes later he started having severe abdominal pain again after which he had episodes of nausea and vomiting, and he presented to the Emergency Room for further evaluation. In the Emergency Room, he was noted to have right upper quadrant pain on palpation and underwent right upper quadrant ultrasound on the 2nd of July 2015, which revealed cholelithiasis as well as cholecystitis. Hospice services were consulted for management to help manage the patient with multiple medical problems by surgeons.   PAST MEDICAL HISTORY: Significant for history of stage IV low-grade B cell lymphoma involving bone marrow as well as mediastinum status post most recent CT scan done in June 2015 of chest, abdomen and pelvis which revealed stable small mediastinal as well as right hilar lymph nodes. No new adenopathy. No new pulmonary lesions or acute pulmonary findings. Ill-defined lesion was noted in the right hepatic lobe for which MRI was recommended. Cholelithiasis was also  noted. History of iron deficiency anemia and atrial fibrillation status post ablation in December 2007, the patient is in sinus rhythm now, history of cataracts as well as glaucoma, cataracts operated bilaterally, history of chickenpox in the past, TURP in 1989, history of abdominal as well as inguinal hernia surgery, last one in 1993, and history of alcohol abuse. Shingles on the left lower chest area, upper abdomen diagnosed 2 weeks ago.   MEDICATIONS: Acyclovir 400 mg p.o. twice daily, iron sulfate 325 mg p.o. 3 times daily, multivitamins once daily, triamcinolone topical cream 0.1% 3 times daily as needed for itching and rash, and vitamin C 1 gram once daily.   ALLERGIES: PENICILLIN AS WELL AS RITUXAN.  PAST SURGICAL HISTORY: As above.   FAMILY HISTORY: History of stroke. The patient's father died at age of 75 from lung cancer. No other malignancies.   SOCIAL HISTORY: Remote smoker, quit 20 years ago. Approximately 3 to 4 glasses of wine daily, alcohol drinker. Denies recreational drug abuse. Physically active. Used to walk in the past, 5 miles, now he has been down to 1 mile only and he tells me that he has been short of breath with walking 1 mile.   REVIEW OF SYSTEMS: Positive for fatigue since March 2015, chemotherapy, pains in the abdomen, weight loss of approximately 80 pounds since June 2015 since his lymphoma operation, also intermittent shortness of breath, especially on exertion, glaucoma, bilateral cataract removal in the past, postnasal drip, which is chronic, some cough with some phlegm production usually in the mornings, and shortness of breath, as mentioned above, usually with exertion. No chest pains. Intermittent nausea and vomiting  over the past few days and abdominal pain over the past few days. He had syncope in 2014. Abnormal bowel movements, which are loose, seem to be chronically loose. Admits of having lower extremity swelling over the past few months which is also chronic and  resolves during nighttime. Admits of having some shingles diagnosed approximately 2 weeks ago. Also overactive bladder, but denies any significant incontinence. CONSTITUTIONAL: Denies any high fevers or chills, weakness, or weight gain. EYES: Denies any blurry vision, double vision.  EARS, NOSE, THROAT: Denies any tinnitus, allergies, epistaxis, sinus pain, dentures, difficulty swallowing.  RESPIRATORY: Denies wheezes, asthma, COPD. CARDIOVASCULAR: Denies chest pain, orthopnea, edema, arrhythmias, palpitations or syncope. GASTROINTESTINAL: Denies any hematemesis, rectal bleeding, change in bowel habits.  GENITOURINARY: Denies dysuria, hematuria, frequency, incontinence.  ENDOCRINOLOGY: Denies polydipsia, nocturia, thyroid problems, heat or cold intolerance or thirst. HEMATOLOGIC: Denies anemia, easy bruising, bleeding or swollen glands. SKIN: Denies any acne, rash, lesions or change in moles.  MUSCULOSKELETAL: Denies arthritis, cramps, swelling. NEUROLOGIC: Denies epilepsy or tremor. PSYCHIATRY: Denies anxiety, insomnia, or depression.  PHYSICAL EXAMINATION: VITAL SIGNS: On arrival to the hospital, temperature is 97.7, pulse was 65, respiration was 18, blood pressure 126/53, and saturation was 98% on room air.  GENERAL: This is a well-developed, well-nourished Caucasian male, in no significant distress, lying on the stretcher.  HEENT: Pupils are equal and reacting to light. Extraocular movements intact. No icterus or conjunctivitis. Has normal hearing. No pharyngeal erythema. Mucosa is moist.  NECK: No masses. Supple and nontender. Thyroid is not enlarged. No adenopathy. No JVD or carotid bruits bilaterally. Full range of motion. LUNGS: Clear to auscultation in all fields, few rhonchi were heard but there were no rales, rhonchi or wheezing. No labored inspirations, increased effort, dullness to percussion or overt respiratory distress.  CARDIOVASCULAR: S1 and S2 appreciated. No murmurs, gallops  or rubs noted. Rhythm was regular intermittently; however, usual rhythm is regular with intermittent irregularities. Very faint 1 to 2/6 systolic murmur was heard radiating to axilla. PMI not lateralized. Chest is nontender to palpation. 2+ lower extremity pulses. No significant lower extremity edema, calf tenderness or cyanosis was noted.  ABDOMEN: Soft, minimally tender in right upper quadrant, but no hepatosplenomegaly or masses were noted.  RECTAL: Deferred.  MUSCLE STRENGTH: Able to move all extremities. No cyanosis, degenerative joint disease or kyphosis. Gait was not tested.  SKIN: Did not reveal any rashes, lesions, erythema, nodularity or induration. It was warm and dry to palpation.  LYMPHATIC: No adenopathy in the cervical region.  NEUROLOGIC: Cranial nerves grossly intact. Sensory is intact. No dysarthria or aphasia.  PSYCH: The patient is alert and oriented to time, person and place. Cooperative. Memory is good. No significant confusion, agitation or depression noted.  DIAGNOSTIC DATA: BMP showed glucose 142, otherwise BMP was unremarkable. Lipase level normal at 89. Liver enzymes: Albumin level of 3.0, alkaline phosphatase 132, AST 99, otherwise BMP was unremarkable. The patient's cardiac enzymes x1 within normal limits. White blood cell count is elevated at 14.6, hemoglobin was 10.7, and platelet count 331,000. Absolute neutrophil count is 11.8. Urinalysis: Yellow clear urine, negative for glucose, bilirubin or ketones. Specific gravity 1.016, pH 6, negative for blood, 30 mg/dL protein, negative for nitrites or leukocyte esterase, 5 red blood cells, 1 white blood cell, no bacteria or epithelial cells were noted. Mucus was present as well as 2 hyaline casts.   Ultrasound of abdomen, limited survey, 2nd of July 2015, showed cholelithiasis with acute cholecystitis.   EKG revealed normal sinus  rhythm with anterior fascicular block, minimal voltage criteria for LVH, may be of normal variant,  according to EKG criteria. Cannot rule out anterior infarct, age indeterminate, with R-wave progression in V3. Looking at prior EKG, done in April 2014, no significant changes were noted.   ASSESSMENT AND PLAN: 1.  Acute cholecystitis with gallstones. The patient has minimal perioperative risks for cardiovascular events. He has no history of coronary artery disease, no chest pains on exertion, and no cardiovascular disease equivalents, however, the patient does get dyspneic intermittently on exertion and it could be related to chemotherapy as well as lymphoma itself. We recommend to proceed to the operating room and get echocardiogram postoperatively to evaluate his cardiac function for valvular disease.  2.  Lymphoma. He had his last chemotherapy in March 2015. The patient has no pancytopenia. He has mild anemia for which he is to continue iron supplementation  whenever he is able to take p.o.  3.  Dyspnea. Could be due to lymphoma with mediastinal as well as hilar lymphadenopathy; however, we need to rule out cardiomyopathy. Will get echocardiogram as well.  4.  Lower extremity swelling. None at present. We will follow the patient closely for fluid overload.   TIME SPENT: 50 minutes.   ____________________________ Theodoro Grist, MD rv:sb D: 07/25/2013 15:02:14 ET T: 07/25/2013 15:40:19 ET JOB#: [id number]  cc: Theodoro Grist, MD, <Dictator> Derinda Late, MD Eek MD ELECTRONICALLY SIGNED 08/14/2013 13:55

## 2014-05-17 NOTE — Op Note (Signed)
PATIENT NAME:  [name], [name]#:  [id number] DATE OF BIRTH:  02-08-1932  DATE OF PROCEDURE:  07/26/2013  PREOPERATIVE DIAGNOSIS: Acute cholecystitis.   POSTOPERATIVE DIAGNOSIS: Acute cholecystitis.   PROCEDURE: Laparoscopic cholecystectomy with C-arm fluoroscopic cholangiography.   SURGEON: Dr. Phoebe Perch.   ANESTHESIA: General with endotracheal tube.   INDICATIONS: This is a patient with a history of right upper quadrant pain and signs of elevated liver function tests. Preoperatively, we discussed rationale for surgery, the options of observation, risk of bleeding, infection, recurrence of symptoms, failure to resolve his symptoms, open procedure, bile duct damage, bile duct leak, retained common bile duct stone, any of which could require further surgery and/or ERCP, stent, and papillotomy. We also discussed the fact that he has had a prior ventral hernia repair with recurrences and mesh placement and that the risk of bowel adhesions may make it difficult to the obtain access with the laparoscope necessitating open procedure, or it damage to bowel. This was emphasized for him as an unknown risk and they understood and agreed to proceed.   FINDINGS: Adhesions infraumbilical including small bowel and large bowel. No adhesions in the upper abdomen.   C-arm fluoroscopic cholangiography demonstrated good flow in the duodenum without intraluminal filling defects. The cystic duct had been cannulated. Proximal ducts were well identified.   DESCRIPTION OF PROCEDURE: The patient was induced to general anesthesia given he was on IV antibiotics. VTE prophylaxis was in place. He was prepped and draped in a sterile fashion. Marcaine was infiltrated in the skin and subcutaneous tissues around the right lateral abdomen. An incision was made and a 12-mm was utilized to again access to the abdomen. Pneumoperitoneum was obtained, and the abdominal cavity was explored. Adhesions were noted in the  periumbilical and infraumbilical area, but none in the right upper quadrant.   Under direct vision, a 10-mm epigastric port was placed at the falciform ligament, and then with the camera in the epigastric site viewing back towards the right lower quadrant, 2 additional 5-mm ports were placed under direct vision as well. Further exploration with the camera in different port sites demonstrated the above without sign of bowel injury.   The camera was placed into one of the 5 mm ports in the right lateral side and the gallbladder was placed on tension. The peritoneum over the infundibulum was incised bluntly. The cystic duct gallbladder junction was well identified. The cystic artery was doubly clipped and divided in multiple branches and this allowed for good visualization of the cystic duct as it entered the infundibulum. Here, it was clipped and incised and through a separate incision, an Angiocath cholangiogram catheter was placed. C-arm fluoroscopic cholangiography demonstrated the above. The clip was removed as was the catheter. The cystic duct was then doubly clipped and divided. The gallbladder was taken from the gallbladder fossa with electrocautery passed out through the epigastric port site with the aid of an Endo Catch bag. The area was checked for hemostasis, irrigated with copious amounts of normal saline. Hemostasis was adequate. There was no sign of bleeding or bile leak and no sign of sign of bowel injury. Again, the camera was placed in multiple port sites to ensure that there had been no bowel injury. None were identified.   Under direct vision utilizing an Endo Close technique the 12-mm Visiport site was closed with multiple simple sutures of 0 Vicryl, Endo Close technique, and then the same was done with the epigastric port utilizing the Endo Close technique and  multiple 0 Vicryl. Again, hemostasis was checked and found to be adequate. Pneumoperitoneum was released. All ports were removed and  skin staples were placed at all incisions with the exception of the Vis-A-Port site, which had some skin bleeding, venous in nature, which was controlled with horizontal mattress sutures of 2-0 nylon. Sterile dressings were placed.   The patient tolerated the procedure well. There were no complications. He was taken to the recovery room in stable condition to be admitted for continued care.    ____________________________ Jerrol Banana. Burt Knack, MD rec:lt D: 07/26/2013 12:41:36 ET T: 07/27/2013 00:24:17 ET JOB#: [id number]  cc: Jerrol Banana. Burt Knack, MD, <Dictator> Florene Glen MD ELECTRONICALLY SIGNED 07/27/2013 9:12

## 2014-05-17 NOTE — Discharge Summary (Signed)
PATIENT NAME:  [name], [name] MR#:  [id number] DATE OF BIRTH:  [birth year]  DATE OF ADMISSION:  08/01/2013 DATE OF DISCHARGE:  08/03/2013  PRINCIPAL DIAGNOSIS: Postoperative rectus sheath hematoma.   OTHER DIAGNOSES: Anemia, chronic lymphocytic leukemia, hypercholesterolemia, hypertension, history of atrial ablation for atrial fibrillation.   HOSPITAL COURSE: The patient was admitted to the hospital for pain management, but his anemia worsened and, after consultation with Dr. Ma Hillock, he was transfused and had an appropriate response, where his hemoglobin came up to 7.8. His pain was controlled with p.o. analgesics and he was discharged home (again). He was asked to make an appointment with our office or to keep the appointment that he already had. His discharge medications were iron sulfate, vitamin C, acyclovir, ibuprofen and Percocet.    ____________________________ Consuela Mimes, MD wfm:cg D: 08/09/2013 21:31:00 ET T: 08/10/2013 06:43:42 ET JOB#: [id number]  cc: Consuela Mimes, MD, <Dictator> Consuela Mimes MD ELECTRONICALLY SIGNED 08/10/2013 19:37

## 2014-05-17 NOTE — H&P (Signed)
PATIENT NAME:  [name], [name] MR#:  [id number] DATE OF BIRTH:  1932/05/15  DATE OF ADMISSION:  07/25/2013  CHIEF COMPLAINT: Right upper quadrant pain.   HISTORY OF PRESENT ILLNESS: This is a patient who has had 2 episodes of right upper quadrant pain in the last 18 hours. The first one lasted several hours after eating dinner last night. He woke up this morning and was pain-free. He ate breakfast and then he had pain, which was unrelenting but improving at this point. He has had nausea and vomiting. No fevers, no chills, no jaundice, and no acholic stools. He has never had an episode like this before.   PAST MEDICAL HISTORY: Chronic lymphocytic leukemia, in remission. He states he was last treated for it in March of this year, but has no evidence of disease at this point. He has also had a history of hypertension, but is not medicated for it. He had a history of atrial fibrillation, but underwent ablation and it has resolved. He is also being treated for shingles at this point.   MEDICATIONS: Acyclovir.  ALLERGIES: PENICILLIN AND RITUXAN.   FAMILY HISTORY: Noncontributory.   SOCIAL HISTORY: The patient does not smoke or drink. 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   REVIEW OF SYSTEMS: Ten system review is performed and negative with the exception of that mentioned in the HPI.  PAST SURGICAL HISTORY: Cataract surgery. He had an umbilical hernia repair that recurred and it recurred twice necessitating mesh. Most recent surgery was in 1996 with a large incision and presumably a large sheet of mesh, unknown type. This was done in [place].   He has also had bilateral inguinal hernia repairs.   PHYSICAL EXAMINATION: GENERAL: Healthy, comfortable-appearing male patient.  VITAL SIGNS: Stable. He is afebrile.  HEENT: No scleral icterus.  NECK: No palpable neck nodes.  CHEST: Clear to auscultation.  CARDIAC: Regular rate and rhythm.  ABDOMEN: Soft, protuberant, tender in the right upper  quadrant with a positive Murphy's sign. There is a long midline incision present without hernia.  EXTREMITIES: Minimal edema.  NEUROLOGIC: Grossly intact.  INTEGUMENT: No jaundice.  DIAGNOSTIC DATA: An ultrasound shows stones.   Laboratory values demonstrate normal liver function tests, elevated white blood cell count, and a H and H of 10.7 and 32.4 and platelet count of 331,000.   ASSESSMENT AND PLAN: This is a patient with a history of chronic lymphocytic leukemia  who presents now with 2 severe episodes of right upper quadrant pain following eating. This one is resolving. The one last night resolved completely, however, the patient has chronic lymphocytic leukemia and is at extreme risk for complication from acute cholecystitis. I have recommended admission. I did discuss with him the options of observation and discharge and seeing him as an outpatient, but he prefers to be admitted. The rationale for this has been discussed. The need to proceed to surgery in the next day or two has been discussed and the risks of bleeding, infection, open procedure, the high likelihood of proceeding with an open procedure due to his likely large piece of mesh from recurrent ventral hernias, risk of bowel injury or bile duct injury, any of which could require further surgery was all reviewed with him. He and his wife understood and agreed to proceed.  ____________________________ Jerrol Banana Burt Knack, MD rec:sb D: 07/25/2013 16:27:03 ET T: 07/25/2013 16:43:50 ET JOB#: [id number]  cc: Jerrol Banana. Burt Knack, MD, <Dictator> Florene Glen MD ELECTRONICALLY SIGNED 07/25/2013 17:14

## 2014-05-17 NOTE — H&P (Signed)
Subjective/Chief Complaint abdominal pain rlq swelling.   History of Present Illness 81 y/o s/p lap chole on July 3rd developed rlq abdominal pain seen in ER and w/u shows large right rectus sheath and smaller rlq hematoma.  No nausea or emesis, no fevers, does not take anticoagulation or anti-platelet therapy.   Past History CLL cholecystitis   Past Med/Surgical Hx:  lymphoma: CLL  Anemia:   Hypercholesterolemia:   Hypertension:   Hernia Repair:   Atrial Ablation:   A-fib:   ALLERGIES:  Penicillin: Hives  Rituxan: Other, Fever, Muscles aches  Levaquin: Rash   Other Allergies none   HOME MEDICATIONS: Medication Instructions Status  acetaminophen-HYDROcodone 325 mg-5 mg oral tablet 1 tab(s) orally every 4 hours, As needed, pain Active  acyclovir 400 mg oral tablet 1 tab(s) orally 2 times a day Active  ferrous sulfate 325 mg oral tablet 1 tab(s) orally 3 times a day Active  multivitamin 1 tab(s) orally once a day Active  Vitamin C 1000 mg oral tablet 1 tab(s) orally once a day Active  triamcinolone topical 0.1% topical cream Apply topically to affected area 3 times a day, As Needed - for Itching and rash Active   Family and Social History:  Family History Non-Contributory   Social History negative tobacco, negative ETOH   Place of Living Home   Review of Systems:  Subjective/Chief Complaint see above   Physical Exam:  GEN no acute distress, thin, temp 98.8 p 81 bp 134/60   HEENT pale conjunctivae, PERRL   NECK supple   RESP normal resp effort   ABD positive tenderness  right rectus mass, tender, no eccymosis present.   SKIN normal to palpation   NEURO cranial nerves intact   PSYCH A+O to time, place, person, good insight   Lab Results: Hepatic:  05-Jul-15 23:26   Bilirubin, Total 0.6  Alkaline Phosphatase 99 (45-117 NOTE: New Reference Range 12/14/12)  SGPT (ALT) 34  SGOT (AST) 15  Total Protein, Serum 6.9  Albumin, Serum  2.9  Routine Chem:   05-Jul-15 23:26   Glucose, Serum  106  BUN 18  Creatinine (comp) 0.88  Sodium, Serum  135  Potassium, Serum 3.8  Chloride, Serum 101  CO2, Serum 28  Calcium (Total), Serum 8.8  Osmolality (calc) 272  eGFR (African American) >60  eGFR (Non-African American) >60 (eGFR values <59m/min/1.73 m2 may be an indication of chronic kidney disease (CKD). Calculated eGFR is useful in patients with stable renal function. The eGFR calculation will not be reliable in acutely ill patients when serum creatinine is changing rapidly. It is not useful in  patients on dialysis. The eGFR calculation may not be applicable to patients at the low and high extremes of body sizes, pregnant women, and vegetarians.)  Anion Gap  6  06-Jul-15 23:26   Lipase 139 (Result(s) reported on 29 Jul 2013 at 03:16AM.)  Cardiac:  06-Jul-15 23:26   Troponin I < 0.02 (0.00-0.05 0.05 ng/mL or less: NEGATIVE  Repeat testing in 3-6 hrs  if clinically indicated. >0.05 ng/mL: POTENTIAL  MYOCARDIAL INJURY. Repeat  testing in 3-6 hrs if  clinically indicated. NOTE: An increase or decrease  of 30% or more on serial  testing suggests a  clinically important change)  Routine UA:  05-Jul-15 23:26   Color (UA) YELLOW  Clarity (UA) CLEAR  Glucose (UA) NEGATIVE  Bilirubin (UA) NEGATIVE  Ketones (UA) NEGATIVE  Specific Gravity (UA) 1.017  Blood (UA) NEGATIVE  pH (UA) 5.0  Protein (UA)  NEGATIVE  Nitrite (UA) NEGATIVE  Leukocyte Esterase (UA) NEGATIVE (Result(s) reported on 28 Jul 2013 at 11:54PM.)  RBC (UA) RARE  WBC (UA) RARE  Bacteria (UA) NEGATIVE  Epithelial Cells (UA) 0-5 / HPF  Result(s) reported on 28 Jul 2013 at 11:54PM.  Routine Coag:  06-Jul-15 23:26   Prothrombin 13.1  INR 1.0 (INR reference interval applies to patients on anticoagulant therapy. A single INR therapeutic range for coumarins is not optimal for all indications; however, the suggested range for most indications is 2.0 - 3.0. Exceptions to  the INR Reference Range may include: Prosthetic heart valves, acute myocardial infarction, prevention of myocardial infarction, and combinations of aspirin and anticoagulant. The need for a higher or lower target INR must be assessed individually. Reference: The Pharmacology and Management of the Vitamin K  antagonists: the seventh ACCP Conference on Antithrombotic and Thrombolytic Therapy. PYKDX.8338 Sept:126 (3suppl): [id number]. A HCT value >55% may artifactually increase the PT.  In one study,  the increase was an average of 25%. Reference:  "Effect on Routine and Special Coagulation Testing Values of Citrate Anticoagulant Adjustment in Patients with High HCT Values." American Journal of Clinical Pathology 2006;126:400-405.)  Routine Hem:  05-Jul-15 23:26   WBC (CBC)  12.7  RBC (CBC)  2.86  Hemoglobin (CBC)  8.3  Hematocrit (CBC)  25.7  Platelet Count (CBC) 330 (Result(s) reported on 28 Jul 2013 at 11:47PM.)  MCV 90  MCH 29.1  MCHC 32.5  RDW  15.6  06-Jul-15 06:18   WBC (CBC)  10.7  RBC (CBC)  2.45  Hemoglobin (CBC)  7.3  Hematocrit (CBC)  21.9  Platelet Count (CBC) 289 (Result(s) reported on 29 Jul 2013 at 06:33AM.)  MCV 89  MCH 29.9  MCHC 33.5  RDW  15.8   Radiology Results: CT:    06-Jul-15 04:51, CT Abdomen and Pelvis With Contrast  CT Abdomen and Pelvis With Contrast  REASON FOR EXAM:    (1) POST LAP CHOLE; (2) BLOATING, PAIN, ILEUS, LOWER   H/H EVAL BLEEDING;    NOTE:  COMMENTS:   May transport without cardiac monitor    PROCEDURE: CT  - CT ABDOMEN / PELVIS  W  - Jul 29 2013  4:51AM     CLINICAL DATA:  Cholecystectomy 3 days ago, history of lymphoma, now  with abdominal pain, swelling.    EXAM:  CT ABDOMEN AND PELVIS WITH CONTRAST    TECHNIQUE:  Multidetector CT imaging of the abdomen and pelvis was performed  using the standard protocol following bolus administration of  intravenous contrast.    CONTRAST:  100 cc of Isovue 370    COMPARISON:  Acute  abdominal series July 28, 2013 and CT of the  chest, abdomen pelvis July 15, 2013    FINDINGS:  LUNG BASES: Included view of the lung bases demonstrate small right  pleural effusion, bibasilar enhancing atelectasis with air patchy  consolidation left lung base. The heart is mildly enlarged, included  pericardium is unremarkable.    SOLID ORGANS: The spleen, pancreas and adrenal glands are  unremarkable. Interval cholecystectomy. Small amount of free fluid  in the gallbladder worse fossa consistent with recent surgery.  Patchy enhancement and right hepatic lobe is unchanged with apparent  early washout, with nodular contour, unchanged and may reflect  cirrhosis.    GASTROINTESTINAL TRACT: The stomach, small and large bowel are  normal in course and caliber without inflammatory changes. Moderate  amount of retained large bowel stool. Sigmoid diverticulosis. The  appendix is not discretely identified, however there are no  inflammatory changes in the right lower quadrant.    KIDNEYS/ URINARY TRACT: Kidneys are orthotopic, demonstrating  symmetric enhancement. No nephrolithiasis, hydronephrosis or renal  masses. Too small to characterize hypodensities in the kidneys  bilaterally. The unopacified ureters are normal in course and  caliber. Delayed imaging through the kidneys demonstrates symmetric  prompt excretion to the proximal urinary collecting system. Urinary  bladder is partially distended with mild invasion at the base by the  prostate, unchanged.    PERITONEUM/RETROPERITONEUM: Moderate amount of intraperitoneal free  air consistent with recent surgery. 22 x 26 mm soft tissue irregular  mass in right lower quadrant suggest hematoma within the mesenteric.  Aortoiliac vessels are normal course and caliber with moderate  calcific atherosclerosis.    SOFT TISSUE/OSSEOUS STRUCTURES: Enlarged at dense right rectus  abdominus muscle consistent with rectus sheath hematoma with  air,  edema extending into the right flank with subcutaneous gas.   IMPRESSION:  Larger right rectus sheath hematoma, with subcutaneous gas  consistent with recent surgery. Moderate amount of pneumoperitoneum  in addition to 22 x 26 mm suspected hematoma within the right aspect  of the mesentery. Status post interval cholecystectomy.  hemorrhage/free fluid extending into the right flank soft tissues.    Stable 18 mm cavoatrial lymph node in patient with known lymphoma.    Possible cirrhosis.    Acute findingsdiscussed with and reconfirmed by Dr.JADE SUNG  on7/6/2015at5:13 AM.    Electronically Signed    By: Elon Alas    On: 07/29/2013 05:16         Verified By: Ricky Ala, M.D.,    Assessment/Admission Diagnosis 81 y/o with post op hematoma likely from right sided port site. hemodynamically stable significantly anemic sec to acute blood loss.   Plan admit, hydrate, pain control, SCD's follow H and H transfuse as needed. will discuss with Dr Rexene Edison.   Electronic Signatures: Sherri Rad (MD)  (Signed 06-Jul-15 07:00)  Authored: CHIEF COMPLAINT and HISTORY, PAST MEDICAL/SURGIAL HISTORY, ALLERGIES, Other Allergies, HOME MEDICATIONS, FAMILY AND SOCIAL HISTORY, REVIEW OF SYSTEMS, PHYSICAL EXAM, LABS, Radiology, ASSESSMENT AND PLAN   Last Updated: 06-Jul-15 07:00 by Sherri Rad (MD)

## 2014-05-17 NOTE — H&P (Signed)
History of Present Illness 2nd readmit for right rectus sheath hematoma post-lap ccy for pain. His pain is only at night. No nausea, vomiting; normal appetite and passing flatus. No fever.   Past Med/Surgical Hx:  Cancer:   lymphoma: CLL  Anemia:   Hypercholesterolemia:   Hypertension:   Hernia Repair:   Atrial Ablation:   A-fib:   ALLERGIES:  Penicillin: Hives  Rituxan: Other, Fever, Muscles aches  Levaquin: Rash  HOME MEDICATIONS: Medication Instructions Status  acetaminophen-HYDROcodone 325 mg-5 mg oral tablet 1 tab(s) orally every 4 hours, As needed, pain Active  acyclovir 400 mg oral tablet 1 tab(s) orally 2 times a day Active  ferrous sulfate 325 mg oral tablet 1 tab(s) orally 3 times a day Active  multivitamin 1 tab(s) orally once a day Active  Vitamin C 1000 mg oral tablet 1 tab(s) orally once a day Active  triamcinolone topical 0.1% topical cream Apply topically to affected area 3 times a day, As Needed - for Itching and rash Active   Family and Social History:  Family History Non-Contributory   Place of Living Home   Review of Systems:  Fever/Chills No   Cough No   Sputum No   Abdominal Pain Yes   Diarrhea No   Constipation No   Nausea/Vomiting No   SOB/DOE No   Chest Pain No   Dysuria No   Tolerating PT Yes   Tolerating Diet Yes   Medications/Allergies Reviewed Medications/Allergies reviewed   Physical Exam:  GEN no acute distress, thin, sallow   HEENT pale conjunctivae, PERRL, hearing intact to voice, moist oral mucosa, Oropharynx clear   NECK supple   RESP normal resp effort  clear BS  no use of accessory muscles   CARD regular rate  no murmur  no thrills  No LE edema  no JVD  no Rub   ABD positive tenderness  soft on left, nondistended; right abdominal protrusion, which is tender, in area of known hematoma: no fluctuance or erythema   EXTR negative cyanosis/clubbing, negative edema   SKIN normal to palpation, skin turgor good    NEURO cranial nerves intact, negative tremor, follows commands, motor/sensory function intact   PSYCH alert, A+O to time, place, person   Lab Results: Hepatic:  08-Jul-15 21:58   Bilirubin, Total 0.8  Alkaline Phosphatase 89 (45-117 NOTE: New Reference Range 12/14/12)  SGPT (ALT) 24  SGOT (AST)  14  Total Protein, Serum 7.0  Albumin, Serum  2.8  Routine Chem:  08-Jul-15 21:58   Glucose, Serum  120  BUN 9  Creatinine (comp) 0.87  Sodium, Serum 136  Potassium, Serum 3.9  Chloride, Serum 100  CO2, Serum 27  Calcium (Total), Serum 8.9  Osmolality (calc) 272  eGFR (African American) >60  eGFR (Non-African American) >60 (eGFR values <4m/min/1.73 m2 may be an indication of chronic kidney disease (CKD). Calculated eGFR is useful in patients with stable renal function. The eGFR calculation will not be reliable in acutely ill patients when serum creatinine is changing rapidly. It is not useful in  patients on dialysis. The eGFR calculation may not be applicable to patients at the low and high extremes of body sizes, pregnant women, and vegetarians.)  Anion Gap 9  Lipase  64 (Result(s) reported on 31 Jul 2013 at 10:28PM.)  Routine Hem:  08-Jul-15 21:58   WBC (CBC)  13.3  RBC (CBC)  2.62  Hemoglobin (CBC)  7.6  Hematocrit (CBC)  23.6  Platelet Count (CBC) 399  MCV  90  MCH 29.1  MCHC 32.4  RDW  15.9  Neutrophil % 74.9  Lymphocyte % 9.2  Monocyte % 12.3  Eosinophil % 3.0  Basophil % 0.6  Neutrophil #  9.9  Lymphocyte # 1.2  Monocyte #  1.6  Eosinophil # 0.4  Basophil # 0.1 (Result(s) reported on 31 Jul 2013 at 10:17PM.)   Radiology Results: XRay:    05-Jul-15 23:54, Abdomen 3 Way Includes PA Chest  Abdomen 3 Way Includes PA Chest  REASON FOR EXAM:    abd pain and swelling after surgery  -  ed waiting   room  COMMENTS:       PROCEDURE: DXR - DXR ABDOMEN 3-WAY (INCL PA CXR)  - Jul 28 2013 11:54PM     CLINICAL DATA:  Gallbladder surgery on 07/03. Patient is  not have a  bowel movement or passed gas. He has pain in the right quadrants.    EXAM:  ABDOMEN SERIES    COMPARISON:  None.    FINDINGS:  Normal heart size and pulmonary vascularity. Emphysematous changes  and fibrosis in the lungs. Atelectasis in the left lung base. No  pleural effusions. No pneumothorax.    Surgical clips in the abdomen. Prominent stool in the right colon  with scattered stool in the remainder of the colon. Remainder of the  colon is gas-filled without significant distention, most likely  representing ileus. No free intra-abdominal air is identified. No  radiopaque stones. Calcified phleboliths in the pelvis.     IMPRESSION:  Atelectasis in the left lung base. Prominent stool in the right  colon with scattered stool and gas throughout the remainder of the  colon. Likely ileus. No definite evidence of obstruction. No free  air.  Electronically Signed    By: Lucienne Capers M.D.    On: 07/29/2013 00:06         Verified By: Neale Burly, M.D.,  LabUnknown:  PACS Image    06-Jul-15 04:51, CT Abdomen and Pelvis With Contrast  PACS Image  CT:  CT Abdomen and Pelvis With Contrast  REASON FOR EXAM:    (1) POST LAP CHOLE; (2) BLOATING, PAIN, ILEUS, LOWER   H/H EVAL BLEEDING;    NOTE:  COMMENTS:   May transport without cardiac monitor    PROCEDURE: CT  - CT ABDOMEN / PELVIS  W  - Jul 29 2013  4:51AM     CLINICAL DATA:  Cholecystectomy 3 days ago, history of lymphoma, now  with abdominal pain, swelling.    EXAM:  CT ABDOMEN AND PELVIS WITH CONTRAST    TECHNIQUE:  Multidetector CT imaging of the abdomen and pelvis was performed  using the standard protocol following bolus administration of  intravenous contrast.    CONTRAST:  100 cc of Isovue 370    COMPARISON:  Acute abdominal series July 28, 2013 and CT of the  chest, abdomen pelvis July 15, 2013    FINDINGS:  LUNG BASES: Included view of the lung bases demonstrate small right  pleural  effusion, bibasilar enhancing atelectasis with air patchy  consolidation left lung base. The heart is mildly enlarged, included  pericardium is unremarkable.    SOLID ORGANS: The spleen, pancreas and adrenal glands are  unremarkable. Interval cholecystectomy. Small amount of free fluid  in the gallbladder worse fossa consistent with recent surgery.  Patchy enhancement and right hepatic lobe is unchanged with apparent  early washout, with nodular contour, unchanged and may reflect  cirrhosis.  GASTROINTESTINAL TRACT: The stomach, small and large bowel are  normal in course and caliber without inflammatory changes. Moderate  amount of retained large bowel stool. Sigmoid diverticulosis. The  appendix is not discretely identified, however there are no  inflammatory changes in the right lower quadrant.    KIDNEYS/ URINARY TRACT: Kidneys are orthotopic, demonstrating  symmetric enhancement. No nephrolithiasis, hydronephrosis or renal  masses. Too small to characterize hypodensities in the kidneys  bilaterally. The unopacified ureters are normal in course and  caliber. Delayed imaging through the kidneys demonstrates symmetric  prompt excretion to the proximal urinary collecting system. Urinary  bladder is partially distended with mild invasion at the base by the  prostate, unchanged.    PERITONEUM/RETROPERITONEUM: Moderate amount of intraperitoneal free  air consistent with recent surgery. 22 x 26 mm soft tissue irregular  mass in right lower quadrant suggest hematoma within the mesenteric.  Aortoiliac vessels are normal course and caliber with moderate  calcific atherosclerosis.    SOFT TISSUE/OSSEOUS STRUCTURES: Enlarged at dense right rectus  abdominus muscle consistent with rectus sheath hematoma with air,  edema extending into the right flank with subcutaneous gas.   IMPRESSION:  Larger right rectus sheath hematoma, with subcutaneous gas  consistent with recent surgery. Moderate  amount of pneumoperitoneum  in addition to 22 x 26 mm suspected hematoma within the right aspect  of the mesentery. Status post interval cholecystectomy.  hemorrhage/free fluid extending into the right flank soft tissues.    Stable 18 mm cavoatrial lymph node in patient with known lymphoma.    Possible cirrhosis.    Acute findingsdiscussed with and reconfirmed by Dr.JADE SUNG  on7/6/2015at5:13 AM.    Electronically Signed    By: Elon Alas    On: 07/29/2013 05:16         Verified By: Ricky Ala, M.D.,    Assessment/Admission Diagnosis rectus sheath hematoma post lap-CCY, with mild leukocytosis and stable hematocrit (anemia), on chemoTx   Plan Admit for analgesia Convert FeSO4 to Fe-polysaccharide   Electronic Signatures: Consuela Mimes (MD)  (Signed 09-Jul-15 02:58)  Authored: CHIEF COMPLAINT and HISTORY, PAST MEDICAL/SURGIAL HISTORY, ALLERGIES, HOME MEDICATIONS, FAMILY AND SOCIAL HISTORY, REVIEW OF SYSTEMS, PHYSICAL EXAM, LABS, Radiology, ASSESSMENT AND PLAN   Last Updated: 09-Jul-15 02:58 by Consuela Mimes (MD)

## 2014-05-17 NOTE — H&P (Signed)
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 tobacco, retired Designer, [employer]   Review of Systems:  Fever/Chills No   Cough No   Abdominal Pain Yes   Diarrhea No   Nausea/Vomiting Yes   SOB/DOE No   Chest Pain No   Dysuria No   Tolerating Diet No  Nauseated  Vomiting   Medications/Allergies Reviewed Medications/Allergies reviewed   Physical Exam:  GEN no acute distress   HEENT pink conjunctivae   NECK supple   RESP normal resp effort  clear BS   CARD regular rate   ABD positive tenderness  pos Murphy's; long midline incision   LYMPH negative neck   EXTR positive edema   SKIN normal to palpation   PSYCH alert, A+O to time, place, person, good insight   Lab Results: Hepatic:  02-Jul-15 11:37   Bilirubin, Total 0.9  Alkaline Phosphatase  132 (45-117 NOTE: New Reference Range 12/14/12)  SGPT (ALT) 48  SGOT (AST)  99  Total Protein, Serum 7.3  Albumin, Serum  3.0  Routine Chem:  02-Jul-15 11:37   Lipase 89 (Result(s) reported on 25 Jul 2013 at 12:23PM.)  Glucose, Serum  142  BUN 12  Creatinine (comp) 0.90  Sodium, Serum 138  Potassium, Serum 3.9  Chloride, Serum 102  CO2, Serum 30  Calcium (Total), Serum 9.2  Osmolality (calc) 278  eGFR (African American) >60  eGFR (Non-African American) >60 (eGFR values <4m/min/1.73 m2 may be an  indication of chronic kidney disease (CKD). Calculated eGFR is useful in patients with stable renal function. The eGFR calculation will not be reliable in acutely ill patients when serum creatinine is changing rapidly. It is not useful in  patients on dialysis. The eGFR calculation may not be applicable to patients at the low and high extremes of body sizes, pregnant women, and vegetarians.)  Anion Gap  6  Cardiac:  02-Jul-15 11:37   Troponin I 0.03 (0.00-0.05 0.05 ng/mL or less: NEGATIVE  Repeat testing in 3-6 hrs  if clinically indicated. >0.05 ng/mL: POTENTIAL  MYOCARDIAL INJURY. Repeat  testing in 3-6 hrs if  clinically indicated. NOTE: An increase or decrease  of 30% or more on serial  testing suggests a  clinically important change)  CK, Total  23 (39-308 NOTE: NEW REFERENCE RANGE  02/25/2013)  CPK-MB, Serum 0.7 (Result(s) reported on 25 Jul 2013 at 12:29PM.)  Routine UA:  02-Jul-15 12:29   Color (UA) Yellow  Clarity (UA) Clear  Glucose (UA) Negative  Bilirubin (UA) Negative  Ketones (UA) Negative  Specific Gravity (UA) 1.016  Blood (UA) Negative  pH (UA) 6.0  Protein (UA) 30 mg/dL  Nitrite (UA) Negative  Leukocyte Esterase (UA) Negative (Result(s) reported on 25 Jul 2013 at 12:57PM.)  RBC (UA) 5 /HPF  WBC (UA) 1 /HPF  Bacteria (UA) NONE SEEN  Epithelial Cells (UA) NONE SEEN  Mucous (  UA) PRESENT  Hyaline Cast (UA) 2 /LPF (Result(s) reported on 25 Jul 2013 at 12:57PM.)  Routine Hem:  02-Jul-15 11:37   WBC (CBC)  14.6  RBC (CBC)  3.63  Hemoglobin (CBC)  10.7  Hematocrit (CBC)  32.4  Platelet Count (CBC) 331  MCV 89  MCH 29.4  MCHC 32.9  RDW  15.4  Neutrophil % 81.3  Lymphocyte % 5.0  Monocyte % 11.6  Eosinophil % 1.0  Basophil % 1.1  Neutrophil #  11.8  Lymphocyte #  0.7  Monocyte #  1.7  Eosinophil # 0.1  Basophil #  0.2 (Result(s) reported on 25 Jul 2013 at 12:04PM.)   Radiology Results: [place]:    02-Jul-15 14:02, US Abdomen Limited Survey  US  Abdomen Limited Survey  REASON FOR EXAM:    Rt abd pain,  COMMENTS:   May transport without cardiac monitor    PROCEDURE: [place]  - US ABDOMEN LIMITED SURVEY  - Jul 25 2013  2:02PM     CLINICAL DATA:  Right sided abdominal pain ; known CLL status post  chemotherapy    EXAM:  USABDOMEN LIMITED - RIGHT UPPER QUADRANT    COMPARISON:  CT scan of the abdomen and pelvis of July 15, 2013.    FINDINGS:  Gallbladder:  There are multiple echogenic shadowing stones with the largest  measuring 1.4 cm. There is mild gallbladder wall thickening. There  is a positive sonographic Murphy's sign.    Common bile duct:    Diameter: 6.9 mm.  No intraluminal echoes are demonstrated.    Liver:    No focal lesion identified. Within normal limits in parenchymal  echogenicity.     IMPRESSION:  Cholelithiasis with acute cholecystitis.  Electronically Signed    By: David  Martinique    On: 07/25/2013 14:07         Verified By: DAVID A. Martinique, M.D., MD    Assessment/Admission Diagnosis acute chole admit, hydrate lap chole attempt; has lasrge inc from rec [facility] with mesh, may need open chole risks in detail agrees with plan   Electronic Signatures: Florene Glen (MD)  (Signed 02-Jul-15 16:16)  Authored: CHIEF COMPLAINT and HISTORY, PAST MEDICAL/SURGIAL HISTORY, ALLERGIES, FAMILY AND SOCIAL HISTORY, REVIEW OF SYSTEMS, PHYSICAL EXAM, LABS, Radiology, ASSESSMENT AND PLAN   Last Updated: 02-Jul-15 16:16 by Florene Glen (MD)

## 2014-05-17 NOTE — Discharge Summary (Signed)
PATIENT NAME:  MONTI, JILEK MR#:  [id number] DATE OF BIRTH:  May 26, 1932  DATE OF ADMISSION:  07/25/2013 DATE OF DISCHARGE:  07/27/2013  DIAGNOSES:  1.  Acute cholecystitis.  2.  Elevated liver function tests. 3.  Chronic lymphocytic leukemia. 4.  Hypertension. 5.  Atrial fibrillation.   PROCEDURES: Laparoscopic cholecystectomy with C-arm fluoroscopic cholangiography.  HISTORY OF PRESENT ILLNESS AND HOSPITAL COURSE: This is a patient who has had 2 episodes in 18 hours of severe abdominal pain in the epigastrium and right upper quadrant associated with eating and a work-up showing probable acute cholecystitis. He was taken to the Operating Room where laparoscopic cholecystectomy was performed. Cholangiography demonstrated no sign of choledocholithiasis. He made an uncomplicated postoperative recovery, was discharged in stable condition to follow up in our office in 10 days. He is given oral analgesics for postoperative pain control and will follow up in our office.    ____________________________ Jerrol Banana. Burt Knack, MD rec:ts D: 07/27/2013 10:28:58 ET T: 07/27/2013 15:40:24 ET JOB#: [id number]  cc: Jerrol Banana. Burt Knack, MD, <Dictator> Florene Glen MD ELECTRONICALLY SIGNED 07/27/2013 17:02

## 2014-05-17 NOTE — Consult Note (Signed)
Brief Consult Note: Diagnosis: B cell lymphoma, dyspnea, LE swelling, a. fib in SR now, glaucoma, ETOH abuse, gallstones, cholecystitis.   Patient was seen by consultant.   Consult note dictated.   Recommend to proceed with surgery or procedure.   Orders entered.   Discussed with Attending MD.   Comments: 1. acute cholecystitis, gallstones, pt has minimal perioperative risks for cardiovascular event, but has no h/o CAD, no Cp on exertion, no CAD equivalents, but gets dyspneic with exertion, also have some LE swelling with exertion, which could be directly atributed to chemotherapy and lymphoma, recommend to procede to OR, get Echo postoperatively 2. lymphoma, s/p last chemotherapy March, 2015, no pancytopenia 3. dyspnea, could be due to lymphoma witrh ediastinal and hilar lymphadenopathy, but will r/o cardomyopathy, get echo 4. LE swelling, none at present, following closely Thank  for consult, will follow.  Electronic Signatures: Theodoro Grist (MD)  (Signed 02-Jul-15 14:48)  Authored: Brief Consult Note   Last Updated: 02-Jul-15 14:48 by Theodoro Grist (MD)

## 2014-05-25 NOTE — Op Note (Signed)
PATIENT NAME:  [name], [name] MR#:  [id number] DATE OF BIRTH:  1932/02/05  DATE OF PROCEDURE:  02/24/2014  PREOPERATIVE DIAGNOSIS: Lymphoma with poor venous access.   POSTOPERATIVE DIAGNOSIS: Lymphoma with poor venous access.   PROCEDURE: 1. Right jugular venogram. Needle access, right jugular vein.  2. Fluoroscopic guidance for placement of catheter.  3. Ultrasound guidance for vascular access to the right jugular vein.  4. Placement of CT compatible Infuse-a-Port, right jugular vein.   SURGEON: Algernon Huxley, MD  ANESTHESIA: Local with moderate conscious sedation.   ESTIMATED BLOOD LOSS: 25 mL.   FLUOROSCOPY TIME: Approximately 1 minute and 5 mL of contrast were used.   INDICATION FOR PROCEDURE: A 81 year old gentleman with lymphoma, who needs a Port-A-Cath for chemotherapy and durable venous access. Risks and benefits were discussed. Informed consent was obtained.   DESCRIPTION OF PROCEDURE: The patient is brought to the vascular suite. The right neck and chest were sterilely prepped and draped and a sterile surgical field was created. Right jugular vein was visualized with ultrasound and found to be large and patent. It was then accessed under direct ultrasound guidance without difficulty with a Seldinger needle and a permanent image was recorded. Initially, a wire would not pass easily. I elected to do a  needle jugular venogram. 5 mL of contrast were injected through the jugular vein and allowed to opacify through the jugular vein, innominate, and into the superior vena cava. This showed the jugular vein to be a little tortuous, particularly at the level of the clavicle, but not stenotic. I was able to cross this tortuosity without significant difficulty with a J-wire under fluoroscopic guidance. I then placed through the peel-away sheath over the wire and the wire and dilator were removed. The counter incision was then anesthetized with 1% lidocaine and a pocket was created. After a  transverse incision was created 2 fingerbreadths below the right clavicle. An inferior pocket was created and the port was secured to the chest wall with 2 Prolene sutures connected to the catheter, which was tunneled from the subclavicular incision to the access site. Using fluoroscopic guidance, the catheter was cut to an appropriate length. It was then placed through the peel-away sheath. The peel-away sheath was removed. The catheter was parked in the mid superior vena cava without kink. The port withdrew blood well and flushed easily with heparinized saline and the wound was irrigated with antibiotic-impregnated saline and the subclavicular incision was closed with 3-0 Vicryl and a 4-0 Monocryl. The access incision was closed with a single 4-0 Monocryl. Dermabond was placed as a dressing. The patient was awakened from anesthesia and taken to the recovery room in stable condition, having tolerated the procedure well.    ____________________________ Algernon Huxley, MD jsd:ap D: 02/24/2014 11:02:56 ET T: 02/24/2014 11:44:44 ET JOB#: [id number]  cc: Algernon Huxley, MD, <Dictator> Algernon Huxley MD ELECTRONICALLY SIGNED 02/26/2014 11:58

## 2014-05-29 DIAGNOSIS — N400 Enlarged prostate without lower urinary tract symptoms: Secondary | ICD-10-CM | POA: Insufficient documentation

## 2014-05-29 DIAGNOSIS — I10 Essential (primary) hypertension: Secondary | ICD-10-CM | POA: Insufficient documentation

## 2014-06-30 ENCOUNTER — Ambulatory Visit: Payer: PRIVATE HEALTH INSURANCE | Admitting: Internal Medicine

## 2014-06-30 ENCOUNTER — Other Ambulatory Visit: Payer: PRIVATE HEALTH INSURANCE

## 2014-07-11 DIAGNOSIS — L299 Pruritus, unspecified: Secondary | ICD-10-CM | POA: Insufficient documentation

## 2014-07-17 DIAGNOSIS — Z79899 Other long term (current) drug therapy: Secondary | ICD-10-CM | POA: Insufficient documentation

## 2014-08-10 ENCOUNTER — Telehealth: Payer: Self-pay | Admitting: *Deleted

## 2014-08-10 DIAGNOSIS — C8308 Small cell B-cell lymphoma, lymph nodes of multiple sites: Secondary | ICD-10-CM

## 2014-08-10 NOTE — Telephone Encounter (Signed)
Pt had dropped by Friday 7/15 with rx from Dr. Corey Harold for pt to get labs drawn here in the clinic to follow up on lymphoma while she has him on medication.  This was a doctor we referred him to see about his lymphoma.  We do not have computer set up to enter md from outside as outpt labs so I spoke to pandit and he is agreeable to order labs and we will fax results to dr Reita Chard office [phone].  I  will submit rx to be scanned for our records, enter cbc with diff in system and have sent message to sch. To add appt for lab only for 7/21, 7/28, 8/4.Marland Kitchen  Patient has been notified that these are the dates and time will be 3 pm for each lab only. He is agreeable to the plan.

## 2014-08-12 ENCOUNTER — Inpatient Hospital Stay: Payer: Medicare Other | Attending: Internal Medicine

## 2014-08-12 DIAGNOSIS — C8300 Small cell B-cell lymphoma, unspecified site: Secondary | ICD-10-CM | POA: Diagnosis present

## 2014-08-12 DIAGNOSIS — C8308 Small cell B-cell lymphoma, lymph nodes of multiple sites: Secondary | ICD-10-CM

## 2014-08-12 LAB — CBC WITH DIFFERENTIAL/PLATELET
BASOS ABS: 0.5 10*3/uL — AB (ref 0–0.1)
Band Neutrophils: 2 %
Basophils Relative: 4 %
EOS ABS: 0.8 10*3/uL — AB (ref 0–0.7)
EOS PCT: 7 %
HEMATOCRIT: 26.8 % — AB (ref 40.0–52.0)
Hemoglobin: 8.4 g/dL — ABNORMAL LOW (ref 13.0–18.0)
LYMPHS PCT: 15 %
Lymphs Abs: 1.7 10*3/uL (ref 1.0–3.6)
MCH: 28.2 pg (ref 26.0–34.0)
MCHC: 31.2 g/dL — AB (ref 32.0–36.0)
MCV: 90.3 fL (ref 80.0–100.0)
MONOS PCT: 15 %
Monocytes Absolute: 1.7 10*3/uL — ABNORMAL HIGH (ref 0.2–1.0)
NEUTROS ABS: 6.9 10*3/uL — AB (ref 1.4–6.5)
NEUTROS PCT: 57 %
PLATELETS: 346 10*3/uL (ref 150–440)
RBC: 2.97 MIL/uL — AB (ref 4.40–5.90)
RDW: 26.9 % — AB (ref 11.5–14.5)
WBC: 11.6 10*3/uL — AB (ref 3.8–10.6)

## 2014-08-13 ENCOUNTER — Inpatient Hospital Stay: Payer: Medicare Other

## 2014-08-14 ENCOUNTER — Inpatient Hospital Stay: Payer: Medicare Other

## 2014-08-20 ENCOUNTER — Ambulatory Visit: Payer: Medicare Other

## 2014-08-20 ENCOUNTER — Inpatient Hospital Stay: Payer: Medicare Other

## 2014-08-20 VITALS — BP 136/64 | HR 74 | Temp 98.0°F | Resp 18

## 2014-08-20 DIAGNOSIS — C8300 Small cell B-cell lymphoma, unspecified site: Secondary | ICD-10-CM | POA: Diagnosis not present

## 2014-08-20 LAB — CBC WITH DIFFERENTIAL/PLATELET
BASOS ABS: 0.4 10*3/uL — AB (ref 0–0.1)
Basophils Relative: 5 %
Eosinophils Absolute: 1.3 10*3/uL — ABNORMAL HIGH (ref 0–0.7)
Eosinophils Relative: 16 %
HCT: 25.7 % — ABNORMAL LOW (ref 40.0–52.0)
Hemoglobin: 8.2 g/dL — ABNORMAL LOW (ref 13.0–18.0)
LYMPHS ABS: 1.2 10*3/uL (ref 1.0–3.6)
Lymphocytes Relative: 16 %
MCH: 29.3 pg (ref 26.0–34.0)
MCHC: 32 g/dL (ref 32.0–36.0)
MCV: 91.5 fL (ref 80.0–100.0)
MONO ABS: 0.5 10*3/uL (ref 0.2–1.0)
Monocytes Relative: 6 %
NEUTROS ABS: 4.5 10*3/uL (ref 1.4–6.5)
Neutrophils Relative %: 57 %
PLATELETS: 339 10*3/uL (ref 150–440)
RBC: 2.81 MIL/uL — AB (ref 4.40–5.90)
RDW: 25.2 % — AB (ref 11.5–14.5)
WBC: 7.8 10*3/uL (ref 3.8–10.6)

## 2014-08-20 MED ORDER — SODIUM CHLORIDE 0.9 % IJ SOLN
10.0000 mL | INTRAMUSCULAR | Status: AC | PRN
  Administered 2014-08-20: 10 mL via INTRAVENOUS
  Filled 2014-08-20: qty 10

## 2014-08-20 MED ORDER — HEPARIN SOD (PORK) LOCK FLUSH 100 UNIT/ML IV SOLN
500.0000 [IU] | Freq: Once | INTRAVENOUS | Status: AC
  Administered 2014-08-20: 500 [IU] via INTRAVENOUS

## 2014-08-20 MED ORDER — HEPARIN SOD (PORK) LOCK FLUSH 100 UNIT/ML IV SOLN
INTRAVENOUS | Status: AC
  Filled 2014-08-20: qty 5

## 2014-08-21 ENCOUNTER — Inpatient Hospital Stay: Payer: Medicare Other

## 2014-08-28 ENCOUNTER — Inpatient Hospital Stay: Payer: Medicare Other | Attending: Internal Medicine

## 2014-08-28 DIAGNOSIS — C8300 Small cell B-cell lymphoma, unspecified site: Secondary | ICD-10-CM | POA: Diagnosis present

## 2014-08-28 DIAGNOSIS — C8308 Small cell B-cell lymphoma, lymph nodes of multiple sites: Secondary | ICD-10-CM

## 2014-08-28 DIAGNOSIS — C8518 Unspecified B-cell lymphoma, lymph nodes of multiple sites: Secondary | ICD-10-CM | POA: Insufficient documentation

## 2014-08-28 LAB — CBC WITH DIFFERENTIAL/PLATELET
Basophils Absolute: 0.3 10*3/uL — ABNORMAL HIGH (ref 0–0.1)
Basophils Relative: 3 %
EOS ABS: 1.8 10*3/uL — AB (ref 0–0.7)
EOS PCT: 18 %
HEMATOCRIT: 27.1 % — AB (ref 40.0–52.0)
Hemoglobin: 8.8 g/dL — ABNORMAL LOW (ref 13.0–18.0)
Lymphocytes Relative: 5 %
Lymphs Abs: 0.5 10*3/uL — ABNORMAL LOW (ref 1.0–3.6)
MCH: 29.9 pg (ref 26.0–34.0)
MCHC: 32.6 g/dL (ref 32.0–36.0)
MCV: 91.9 fL (ref 80.0–100.0)
Monocytes Absolute: 1.3 10*3/uL — ABNORMAL HIGH (ref 0.2–1.0)
Monocytes Relative: 13 %
NEUTROS ABS: 6.2 10*3/uL (ref 1.4–6.5)
NEUTROS PCT: 61 %
Platelets: 332 10*3/uL (ref 150–440)
RBC: 2.95 MIL/uL — AB (ref 4.40–5.90)
RDW: 24.6 % — ABNORMAL HIGH (ref 11.5–14.5)
WBC: 10 10*3/uL (ref 3.8–10.6)

## 2014-08-29 ENCOUNTER — Encounter: Payer: Self-pay | Admitting: Cardiovascular Disease

## 2014-08-29 ENCOUNTER — Other Ambulatory Visit
Admission: RE | Admit: 2014-08-29 | Discharge: 2014-08-29 | Disposition: A | Discharge status: Home / Self Care | Payer: Medicare Other | Source: Ambulatory Visit | Attending: Cardiovascular Disease | Admitting: Cardiovascular Disease

## 2014-08-29 ENCOUNTER — Ambulatory Visit (INDEPENDENT_AMBULATORY_CARE_PROVIDER_SITE_OTHER): Payer: Medicare Other | Admitting: Cardiovascular Disease

## 2014-08-29 VITALS — BP 120/56 | HR 75 | Ht 71.0 in | Wt 183.8 lb

## 2014-08-29 DIAGNOSIS — R002 Palpitations: Secondary | ICD-10-CM | POA: Insufficient documentation

## 2014-08-29 DIAGNOSIS — R0602 Shortness of breath: Secondary | ICD-10-CM

## 2014-08-29 DIAGNOSIS — I480 Paroxysmal atrial fibrillation: Secondary | ICD-10-CM

## 2014-08-29 LAB — BASIC METABOLIC PANEL
ANION GAP: 9 (ref 5–15)
BUN: 14 mg/dL (ref 6–20)
CO2: 29 mmol/L (ref 22–32)
Calcium: 8.5 mg/dL — ABNORMAL LOW (ref 8.9–10.3)
Chloride: 99 mmol/L — ABNORMAL LOW (ref 101–111)
Creatinine, Ser: 0.66 mg/dL (ref 0.61–1.24)
GLUCOSE: 101 mg/dL — AB (ref 65–99)
Potassium: 3.7 mmol/L (ref 3.5–5.1)
SODIUM: 137 mmol/L (ref 135–145)

## 2014-08-29 LAB — HEPATIC FUNCTION PANEL
ALT: 12 U/L — ABNORMAL LOW (ref 17–63)
AST: 11 U/L — AB (ref 15–41)
Albumin: 2.8 g/dL — ABNORMAL LOW (ref 3.5–5.0)
Alkaline Phosphatase: 81 U/L (ref 38–126)
BILIRUBIN DIRECT: 0.2 mg/dL (ref 0.1–0.5)
BILIRUBIN TOTAL: 0.4 mg/dL (ref 0.3–1.2)
Indirect Bilirubin: 0.2 mg/dL — ABNORMAL LOW (ref 0.3–0.9)
Total Protein: 7 g/dL (ref 6.5–8.1)

## 2014-08-29 LAB — HEMOGLOBIN A1C: Hgb A1c MFr Bld: 5.5 % (ref 4.0–6.0)

## 2014-08-29 NOTE — Progress Notes (Signed)
Cardiology Office Note   Date:  08/29/2014   ID:  [name], DOB 1932/09/17, MRN [id number]  PCP:  Marcello Fennel, MD  Cardiologist:   Thayer Headings, MD   Chief Complaint  Patient presents with  . other    Ref by Dr. Loney Hering for heart palpitations; patient is now taking a chemotherapy tablet and not sure if causing the irreg. heart beats. Pt. c/o shortness of breath, LE edema and palpitations.    Problem list: 1. CLL 2. Premature ventricular contractions 3.  History of atrial fibrillation-status post A. fib ablation ( Dr. Irineo Axon, [place], 2007)  4.  Essential hypertension     History of Present Illness: [name] is a 81 y.o. male who presents for evaluation of PVCs. He was diagnosed with CLL many years ago. Became a problem in 2013 Started chemotherapy in 2014.   Started on Revlimid 2 months ago,   Started having palpitations since starting Revlimid Has chronic fatigue, weakness, Has started ankle swelling - started several months ago , had a venous duplex scan about 2 years ago .  Which  Is reportedly normal .  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 He has done some physical therapy  Former smoker , Drank in the past   Past Medical History  Diagnosis Date  . Atrial fibrillation 2007    s/p ablation  . Glaucoma   . Hypertension   . BPH (benign prostatic hypertrophy) 1989    s/p TURP  . History of chicken pox   . Microhematuria 1994    longstanding per pt, states normal w/u in past (has had cystoscopy, CT)  . Diastolic CHF 09/5636    grade 1  . Diverticulosis of colon (without mention of hemorrhage)   . Colon polyps 06/2012    multiple tubular adenomas, rec rpt 1 yr [facility])  . CLL (chronic lymphocytic leukemia)     Past Surgical History  Procedure Laterality Date  . Hernia repair  x6    inguinal, ventral  . Cataract extraction,  bilateral    . Umbilical hernia repair      strangulated  . Radiofrequency ablation  2007    Afib  . Transurethral resection of prostate  1989    BPH  . Colonoscopy  06/2008    no polyps, rec rpt 4-5 yrs given hx polyps (done in [facility])  . US echocardiography  04/2012    EF 55-60%, normal systol fxn, grade 1 diastolic dysfunction, mildly dilated LA, mild pulm HTN  . Carotid US  04/2012    mild plaque bilaterally (0-39%)  . Colonoscopy  06/2012    adenomatous polyp, severe diverticulosis, rec rpt 1 yr [facility])  . Esophagogastroduodenoscopy  06/2012    duodenal divertic, o/w WNL [facility] Iles)  . Cardiac catheterization  2007     Current Outpatient Prescriptions  Medication Sig Dispense Refill  . aspirin 81 MG tablet Take 81 mg by mouth daily.    . Cyanocobalamin (VITAMIN B 12 PO) Take by mouth.    . DiphenhydrAMINE HCl (BENADRYL ALLERGY PO) Take by mouth as needed.    . ferrous sulfate 325 (65 FE) MG tablet Take 325 mg by mouth daily with breakfast.    . lenalidomide (REVLIMID) 15 MG capsule Take 15 mg by mouth daily.    . Multiple Vitamins-Minerals (MULTIVITAMIN PO) Take by  mouth.    . predniSONE (DELTASONE) 20 MG tablet Take 20 mg by mouth as directed.     No current facility-administered medications for this visit.   Facility-Administered Medications Ordered in Other Visits  Medication Dose Route Frequency Provider Last Rate Last Dose  . sodium chloride 0.9 % injection 10 mL  10 mL Intravenous PRN Leia Alf, MD   10 mL at 08/20/14 1120    Allergies:   Rituximab; Penicillin g potassium in d5w; and Penicillins    Social History:  The patient  reports that he quit smoking about 21 years ago. His smoking use included Cigarettes. He started smoking about 66 years ago. He has a 40 pack-year smoking history. He has never used smokeless tobacco. He reports that he drinks alcohol. He reports that he does not use illicit drugs.   Family History:  The patient's family history  includes Cancer in his father; Diabetes in his brother; Stroke in his mother. There is no history of CAD. He was adopted.    ROS:  Please see the history of present illness.    Review of Systems: Constitutional:  denies fever, chills, diaphoresis, appetite change and fatigue.  HEENT: denies photophobia, eye pain, redness, hearing loss, ear pain, congestion, sore throat, rhinorrhea, sneezing, neck pain, neck stiffness and tinnitus.  Respiratory: denies SOB, DOE, cough, chest tightness, and wheezing.  Cardiovascular: admits to  palpitations and leg swelling.  Gastrointestinal: denies nausea, vomiting, abdominal pain, diarrhea, constipation, blood in stool.  He has a poor appetite.   Genitourinary: denies dysuria, urgency, frequency, hematuria, flank pain and difficulty urinating.  Musculoskeletal: denies  myalgias, back pain, joint swelling, arthralgias and gait problem.   Skin: denies pallor, rash and wound.  Neurological: denies dizziness, seizures, syncope, weakness, light-headedness, numbness and headaches.   Hematological: denies adenopathy, easy bruising, personal or family bleeding history.  Psychiatric/ Behavioral: denies suicidal ideation, mood changes, confusion, nervousness, sleep disturbance and agitation.       All other systems are reviewed and negative.    PHYSICAL EXAM: VS:  BP 120/56 mmHg  Pulse 75  Ht 5\' 11"  (1.803 m)  Wt 83.348 kg (183 lb 12 oz)  BMI 25.64 kg/m2 , BMI Body mass index is 25.64 kg/(m^2). GEN: Well nourished, well developed, in no acute distress HEENT: normal Neck: no JVD, carotid bruits, or masses Cardiac: RR wjith frequent premature beats; 1-2 / 6 systolic murmur, rubs, or gallops, 2-3 + soft  edema  Respiratory:  clear to auscultation bilaterally, normal work of breathing GI: soft, nontender, nondistended, + BS MS: no deformity or atrophy Skin: warm and dry, no rash Neuro:  Strength and sensation are intact Psych: normal   EKG:  EKG is  ordered today. The ekg ordered today demonstrates normal sinus rhythm with frequent premature ventricular contractions and premature atrial contractions.   Recent Labs: 02/26/2014: BUN 13; SGPT (ALT) 14; Sodium 136 05/07/2014: Creatinine 0.81 05/09/2014: Potassium 3.6 08/28/2014: Hemoglobin 8.8*; Platelets 332    Lipid Panel    Component Value Date/Time   CHOL 95 11/02/2011   TRIG 44 11/02/2011   HDL 58 11/02/2011   LDLDIRECT 28 11/02/2011      Wt Readings from Last 3 Encounters:  08/29/14 83.348 kg (183 lb 12 oz)  07/31/12 88.905 kg (196 lb)  07/20/12 87.544 kg (193 lb)      Other studies Reviewed: Additional studies/ records that were reviewed today include: . Review of the above records demonstrates:    ASSESSMENT AND PLAN:  1.  Palpitations: The patient has evidence of premature atrial contractions and premature ventricular contractions. He has many reasons that he might have these heartbeat irregular disease. He has chronic lymphocytic leukemia. He somewhat malnourished. He may have a lateral light abdomen allergies. He is also on chemotherapy and recently was started on Revlimid. I've seen Revlimid cause atrial fibrillation in the past. It may be contributed to his PACs and PVCs.  I've reassured him that these heartbeat irregularties are benign. I would like to get an echocardiogram to ensure that he doesn't have congestive heart failure. This is especially important since he's also had chronic leg edema.  I've encouraged him to try to eat better.  2. Leg edema. He's had chronic leg edema. He has multiple reasons for this leg edema including anemia, low albumen. We discussed possibly doing a venous duplex and that he would like to hold off. I've given him some instructions on leg elevation and compression hose. I'll see him again in 6 months for follow-up visit. The edema is very soft and is more c/w low protein than CHF.   Current medicines are reviewed at length with the  patient today.  The patient does not have concerns regarding medicines.  The following changes have been made:  no change  Labs/ tests ordered today include:  Orders Placed This Encounter  Procedures  . EKG 12-Lead     Disposition:   FU with 6 months      [name], Wayne Cheng, MD  08/29/2014 8:26 AM    [facility] [place], [place], [place]  [address] Phone: [phone]; Fax: [phone]   [facility]  [address] [place], [place]  [address] [phone]   Fax [phone]

## 2014-08-29 NOTE — Patient Instructions (Addendum)
Medication Instructions:  Your physician recommends that you continue on your current medications as directed. Please refer to the Current Medication list given to you today.   Labwork: Your physician recommends that you have labs today: BMP, Liver, hgba1c   Testing/Procedures: Your physician has requested that you have an echocardiogram. Echocardiography is a painless test that uses sound waves to create images of your heart. It provides your doctor with information about the size and shape of your heart and how well your heart's chambers and valves are working. This procedure takes approximately one hour. There are no restrictions for this procedure.    Follow-Up: Your physician wants you to follow-up in: six months with Dr. Vilinda Boehringer will receive a reminder letter in the mail two months in advance. If you don't receive a letter, please call our office to schedule the follow-up appointment.   Any Other Special Instructions Will Be Listed Below (If Applicable).    For your  leg edema you  should do  the following 1. Leg elevation - I recommend the Lounge Dr. Leg rest.  See below for details  2. Salt restriction  -  Use potassium chloride instead of regular salt as a salt substitute. 3. Walk regularly 4. Compression hose - [facility] 5. Weight loss     Go to [email or web address]       Echocardiogram An echocardiogram, or echocardiography, uses sound waves (ultrasound) to produce an image of your heart. The echocardiogram is simple, painless, obtained within a short period of time, and offers valuable information to your health care provider. The images from an echocardiogram can provide information such as:  Evidence of coronary artery disease (CAD).  Heart size.  Heart muscle function.  Heart valve function.  Aneurysm detection.  Evidence of a past heart attack.  Fluid buildup around the heart.  Heart muscle thickening.  Assess heart valve  function. LET [facility] CARE PROVIDER KNOW ABOUT:  Any allergies you have.  All medicines you are taking, including vitamins, herbs, eye drops, creams, and over-the-counter medicines.  Previous problems you or members of your family have had with the use of anesthetics.  Any blood disorders you have.  Previous surgeries you have had.  Medical conditions you have.  Possibility of pregnancy, if this applies. BEFORE THE PROCEDURE  No special preparation is needed. Eat and drink normally.  PROCEDURE   In order to produce an image of your heart, gel will be applied to your chest and a wand-like tool (transducer) will be moved over your chest. The gel will help transmit the sound waves from the transducer. The sound waves will harmlessly bounce off your heart to allow the heart images to be captured in real-time motion. These images will then be recorded.  You may need an IV to receive a medicine that improves the quality of the pictures. AFTER THE PROCEDURE You may return to your normal schedule including diet, activities, and medicines, unless your health care provider tells you otherwise. Document Released: 01/08/2000 Document Revised: 05/27/2013 Document Reviewed: 09/17/2012 [facility] Patient Information 2015 [place], [place]. This information is not intended to replace advice given to you by your health care provider. Make sure you discuss any questions you have with your health care provider.

## 2014-09-05 ENCOUNTER — Ambulatory Visit (INDEPENDENT_AMBULATORY_CARE_PROVIDER_SITE_OTHER): Payer: Medicare Other

## 2014-09-05 ENCOUNTER — Other Ambulatory Visit: Payer: Self-pay

## 2014-09-05 DIAGNOSIS — R0602 Shortness of breath: Secondary | ICD-10-CM

## 2014-09-05 DIAGNOSIS — R002 Palpitations: Secondary | ICD-10-CM

## 2014-09-10 ENCOUNTER — Encounter: Payer: Self-pay | Admitting: Cardiovascular Disease

## 2014-09-10 ENCOUNTER — Ambulatory Visit (INDEPENDENT_AMBULATORY_CARE_PROVIDER_SITE_OTHER): Payer: Medicare Other | Admitting: Cardiovascular Disease

## 2014-09-10 VITALS — BP 120/40 | HR 69 | Ht 72.0 in | Wt 178.5 lb

## 2014-09-10 DIAGNOSIS — I159 Secondary hypertension, unspecified: Secondary | ICD-10-CM | POA: Diagnosis not present

## 2014-09-10 DIAGNOSIS — I270 Primary pulmonary hypertension: Secondary | ICD-10-CM | POA: Diagnosis not present

## 2014-09-10 DIAGNOSIS — I272 Pulmonary hypertension, unspecified: Secondary | ICD-10-CM

## 2014-09-10 DIAGNOSIS — R002 Palpitations: Secondary | ICD-10-CM

## 2014-09-10 MED ORDER — FUROSEMIDE 40 MG PO TABS: 40.0000 mg | ORAL_TABLET | Freq: Every day | ORAL | Status: DC

## 2014-09-10 MED ORDER — POTASSIUM CHLORIDE CRYS ER 20 MEQ PO TBCR: 20.0000 meq | EXTENDED_RELEASE_TABLET | Freq: Every day | ORAL | Status: DC

## 2014-09-10 NOTE — Progress Notes (Signed)
Cardiology Office Note   Date:  09/10/2014   ID:  [name], DOB 25-Oct-1932, MRN [id number]  PCP:  Marcello Fennel, MD  Cardiologist:   Thayer Headings, MD   Chief Complaint  Patient presents with  . other    C/o heart palpitations and sob. pulmonary hypertension on echo . Meds reviewed verbally with pt.   Problem list: 1. CLL 2. Premature ventricular contractions 3.  History of atrial fibrillation-status post A. fib ablation ( Dr. Irineo Axon, [place], 2007)  4.  Essential hypertension 5. Pulmonary hypertension       History of Present Illness: [name] is a 81 y.o. male who presents for evaluation of PVCs. He was diagnosed with CLL many years ago. Became a problem in 2013 Started chemotherapy in 2014.   Started on Revlimid 2 months ago,   Started having palpitations since starting Revlimid Has chronic fatigue, weakness, Has started ankle swelling - started several months ago , had a venous duplex scan about 2 years ago .  Which  Is reportedly normal .  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 He has done some physical therapy  Former smoker , Drank in the past  Aug. 17, 2016:  Pt was seen today to follow up with his echo that revealed moderate pulmonary HTN Est. PA pressure of 55 mm Hg  Has had shortness of breath and leg swelling for several months .   Past Medical History  Diagnosis Date  . Atrial fibrillation 2007    s/p ablation  . Glaucoma   . Hypertension   . BPH (benign prostatic hypertrophy) 1989    s/p TURP  . History of chicken pox   . Microhematuria 1994    longstanding per pt, states normal w/u in past (has had cystoscopy, CT)  . Diastolic CHF 05/4625    grade 1  . Diverticulosis of colon (without mention of hemorrhage)   . Colon polyps 06/2012    multiple tubular adenomas, rec rpt 1 yr Sharlett Iles)  . CLL (chronic lymphocytic  leukemia)     Past Surgical History  Procedure Laterality Date  . Hernia repair  x6    inguinal, ventral  . Cataract extraction, bilateral    . Umbilical hernia repair      strangulated  . Radiofrequency ablation  2007    Afib  . Transurethral resection of prostate  1989    BPH  . Colonoscopy  06/2008    no polyps, rec rpt 4-5 yrs given hx polyps (done in [facility])  . US echocardiography  04/2012    EF 55-60%, normal systol fxn, grade 1 diastolic dysfunction, mildly dilated LA, mild pulm HTN  . Carotid US  04/2012    mild plaque bilaterally (0-39%)  . Colonoscopy  06/2012    adenomatous polyp, severe diverticulosis, rec rpt 1 yr Sharlett Iles)  . Esophagogastroduodenoscopy  06/2012    duodenal divertic, o/w WNL Sharlett Iles)  . Cardiac catheterization  2007     Current Outpatient Prescriptions  Medication Sig Dispense Refill  . Ascorbic Acid (VITAMIN C) 1000 MG tablet Take 1,000 mg by mouth daily.    Marland Kitchen aspirin 81 MG tablet Take 81 mg by mouth daily.    . DiphenhydrAMINE HCl (BENADRYL ALLERGY PO) Take by mouth at bedtime.     . docusate sodium (COLACE) 100 MG capsule Take  100 mg by mouth 2 (two) times daily.    . ferrous sulfate 325 (65 FE) MG tablet Take 325 mg by mouth 3 (three) times daily with meals.     . predniSONE (DELTASONE) 20 MG tablet Take 20 mg by mouth as directed.    . psyllium (REGULOID) 0.52 G capsule Take 0.52 g by mouth daily.    Marland Kitchen lenalidomide (REVLIMID) 15 MG capsule Take 15 mg by mouth daily.     No current facility-administered medications for this visit.   Facility-Administered Medications Ordered in Other Visits  Medication Dose Route Frequency Provider Last Rate Last Dose  . sodium chloride 0.9 % injection 10 mL  10 mL Intravenous PRN Leia Alf, MD   10 mL at 08/20/14 1120    Allergies:   Rituximab; Penicillin g potassium in d5w; and Penicillins    Social History:  The patient  reports that he quit smoking about 21 years ago. His smoking use  included Cigarettes. He started smoking about 66 years ago. He has a 40 pack-year smoking history. He has never used smokeless tobacco. He reports that he does not drink alcohol or use illicit drugs.   Family History:  The patient's family history includes Cancer in his father; Diabetes in his brother; Stroke in his mother. There is no history of CAD. He was adopted.    ROS:  Please see the history of present illness.    Review of Systems: Constitutional:  denies fever, chills, diaphoresis, appetite change and fatigue.  HEENT: denies photophobia, eye pain, redness, hearing loss, ear pain, congestion, sore throat, rhinorrhea, sneezing, neck pain, neck stiffness and tinnitus.  Respiratory: denies SOB, DOE, cough, chest tightness, and wheezing.  Cardiovascular: admits to  palpitations and leg swelling.  Gastrointestinal: denies nausea, vomiting, abdominal pain, diarrhea, constipation, blood in stool.  He has a poor appetite.   Genitourinary: denies dysuria, urgency, frequency, hematuria, flank pain and difficulty urinating.  Musculoskeletal: denies  myalgias, back pain, joint swelling, arthralgias and gait problem.   Skin: denies pallor, rash and wound.  Neurological: denies dizziness, seizures, syncope, weakness, light-headedness, numbness and headaches.   Hematological: denies adenopathy, easy bruising, personal or family bleeding history.  Psychiatric/ Behavioral: denies suicidal ideation, mood changes, confusion, nervousness, sleep disturbance and agitation.       All other systems are reviewed and negative.    PHYSICAL EXAM: VS:  BP 120/40 mmHg  Pulse 69  Ht 6' (1.829 m)  Wt 80.967 kg (178 lb 8 oz)  BMI 24.20 kg/m2 , BMI Body mass index is 24.2 kg/(m^2). GEN: Well nourished, well developed, in no acute distress HEENT: normal Neck: no JVD, carotid bruits, or masses Cardiac: RR wjith frequent premature beats; 1-2 / 6 systolic murmur, rubs, or gallops, 2-3 + soft  edema    Respiratory:  clear to auscultation bilaterally, normal work of breathing GI: soft, nontender, nondistended, + BS MS: no deformity or atrophy Skin: warm and dry, no rash Neuro:  Strength and sensation are intact Psych: normal   EKG:  EKG is ordered today. The ekg ordered today demonstrates normal sinus rhythm with frequent premature ventricular contractions and premature atrial contractions.   Recent Labs: 08/28/2014: Hemoglobin 8.8*; Platelets 332 08/29/2014: ALT 12*; BUN 14; Creatinine, Ser 0.66; Potassium 3.7; Sodium 137    Lipid Panel    Component Value Date/Time   CHOL 95 11/02/2011   TRIG 44 11/02/2011   HDL 58 11/02/2011   LDLDIRECT 28 11/02/2011      Wt Readings  from Last 3 Encounters:  09/10/14 80.967 kg (178 lb 8 oz)  08/29/14 83.348 kg (183 lb 12 oz)  07/31/12 88.905 kg (196 lb)      Other studies Reviewed: Additional studies/ records that were reviewed today include: . Review of the above records demonstrates:    ASSESSMENT AND PLAN:  1.  Palpitations: The patient has evidence of premature atrial contractions and premature ventricular contractions. He has many reasons that he might have these heartbeat irregular disease. He has chronic lymphocytic leukemia. He somewhat malnourished. He may have a lateral light abdomen allergies. He is also on chemotherapy and recently was started on Revlimid. I've seen Revlimid cause atrial fibrillation in the past. It may be contributed to his PACs and PVCs.  2. Leg edema. He's had chronic leg edema. He has multiple reasons for this leg edema including anemia, low albumen.   We now found that he has pulmonary hypertension with an estimated PA pressure of 56 - Left ventricle: The cavity size was mildly dilated. There was mild concentric hypertrophy. Systolic function was normal. The estimated ejection fraction was in the range of 60% to 65%. Wall motion was normal; there were no regional wall motion abnormalities.  Left ventricular diastolic function parameters were normal. - Mitral valve: There was mild regurgitation. - Left atrium: The atrium was mildly dilated. - Right ventricle: Systolic function was normal. - Pulmonary arteries: Systolic pressure was moderately elevated. PA peak pressure: 56 mm Hg  3.  Pulmonary Hypertension: He now been found to have palmar hypertension. We do not have an explanation for this.  Revlimid has been associated with venous thrombosis and it's possible that he may have had a pulmonary embolus. I'm not aware that CLL or Revlimid causes pulmonary hypertension out right. We'll get pulmonary function test. I would also like to do a CT angiogram of his lungs to look for pulmonary emboli. If these are negative then we will proceed with right and left heart catheterization. We'll start him on Lasix 40 mg a day and potassium chloride 20 mg once a day. I've given him the okay to cut both of those  back to every other day if he develops generalized weakness. His blood pressure is on the low side at times.  4. Hx of Atrial fib:  S/p ablation     Current medicines are reviewed at length with the patient today.  The patient does not have concerns regarding medicines.  The following changes have been made:  no change  Labs/ tests ordered today include:   Orders Placed This Encounter  Procedures  . EKG 12-Lead     Disposition:   FU with 1 month     Nahser, Wonda Cheng, MD  09/10/2014 2:44 PM    [address], [place], [place]  [address] Phone: [phone]; Fax: [phone]   [facility]  [address] [place], [place]  [address] [phone]   Fax [phone]

## 2014-09-10 NOTE — Patient Instructions (Addendum)
Medication Instructions:  Your physician recommends that you continue on your current medications as directed. Please refer to the Current Medication list given to you today. START taking lasix 40mg  once per day START taking potassium 20mg  once per day  Labwork: none  Testing/Procedures: Non-Cardiac CT Angiography (CTA), is a special type of CT scan that uses a computer to produce multi-dimensional views of major blood vessels throughout the body. In CT angiography, a contrast material is injected through an IV to help visualize the blood vessels   Date of procedure: Tues, August 23, 9:00am, [facility] Entrance Arrival time: 8:45am Nothing to eat or drink after midnight the evening before your procedure.  Your physician has recommended that you have a pulmonary function test. Pulmonary Function Tests are a group of tests that measure how well air moves in and out of your lungs.    Follow-Up: Your physician recommends that you schedule a follow-up appointment in: one month with Dr. Acie Fredrickson.   Any Other Special Instructions Will Be Listed Below (If Applicable).

## 2014-09-11 ENCOUNTER — Telehealth: Payer: Self-pay

## 2014-09-11 NOTE — Telephone Encounter (Signed)
Pt wife called, states she thinks pt was to be scheduled for cath.please call and advise.

## 2014-09-11 NOTE — Telephone Encounter (Signed)
Please reassure the wife that this does of lasix and potassium are what we use very frequently and that we will be checking labs in several weeks ( and please make sure we have a BMP scheduled for 2-3 weeks . Will forward this to the [place] office to address.

## 2014-09-11 NOTE — Telephone Encounter (Signed)
S/w pt who states wife is unavailable and he would prefer [place] to talk with her. States she will return in an hour. Will call back.

## 2014-09-11 NOTE — Telephone Encounter (Signed)
Wife concerned about the dose of Lasix 40 mg and K+ 20 meq both daily. Did review dose that was dictated by Dr Acie Fredrickson but she was concerned that he had always told them the Lasix and K+ numbers should always be same.  Advised wife would forward to Dr Acie Fredrickson to verify dose  Spoke with wife and explained that the cath would be based upon CTA and PFT's

## 2014-09-12 ENCOUNTER — Other Ambulatory Visit: Payer: Self-pay

## 2014-09-12 ENCOUNTER — Ambulatory Visit (INDEPENDENT_AMBULATORY_CARE_PROVIDER_SITE_OTHER): Payer: Medicare Other | Admitting: Pulmonary Disease

## 2014-09-12 DIAGNOSIS — R002 Palpitations: Secondary | ICD-10-CM | POA: Diagnosis not present

## 2014-09-12 DIAGNOSIS — I159 Secondary hypertension, unspecified: Secondary | ICD-10-CM

## 2014-09-12 LAB — PULMONARY FUNCTION TEST
DL/VA % PRED: 74 %
DL/VA: 3.49 ml/min/mmHg/L
DLCO UNC: 15.98 ml/min/mmHg
DLCO unc % pred: 46 %
FEF 25-75 POST: 1.52 L/s
FEF 25-75 Pre: 1.5 L/sec
FEF2575-%CHANGE-POST: 1 %
FEF2575-%PRED-PRE: 71 %
FEF2575-%Pred-Post: 71 %
FEV1-%Change-Post: -1 %
FEV1-%PRED-POST: 59 %
FEV1-%PRED-PRE: 60 %
FEV1-PRE: 1.85 L
FEV1-Post: 1.83 L
FEV1FVC-%Change-Post: -3 %
FEV1FVC-%Pred-Pre: 104 %
FEV6-%CHANGE-POST: 1 %
FEV6-%PRED-PRE: 61 %
FEV6-%Pred-Post: 62 %
FEV6-PRE: 2.47 L
FEV6-Post: 2.51 L
FEV6FVC-%PRED-PRE: 107 %
FEV6FVC-%Pred-Post: 107 %
FVC-%Change-Post: 2 %
FVC-%PRED-PRE: 58 %
FVC-%Pred-Post: 59 %
FVC-PRE: 2.5 L
FVC-Post: 2.56 L
POST FEV1/FVC RATIO: 71 %
POST FEV6/FVC RATIO: 100 %
Pre FEV1/FVC ratio: 74 %
Pre FEV6/FVC Ratio: 100 %

## 2014-09-12 NOTE — Progress Notes (Signed)
PFT performed today with nitrogen washout. 

## 2014-09-12 NOTE — Telephone Encounter (Signed)
S/w pt who was in office for pulmonary appt. Reviewed Dr. Elmarie Shiley recommendations. Order placed for BMP Pt prefers going to [facility] lab or cancer center for labs. Lab order given to patient who states he will have labs drawn 2-3 weeks as Nahser recommends

## 2014-09-16 ENCOUNTER — Ambulatory Visit
Admission: RE | Admit: 2014-09-16 | Discharge: 2014-09-16 | Disposition: A | Discharge status: Home / Self Care | Payer: Medicare Other | Source: Ambulatory Visit | Attending: Cardiovascular Disease | Admitting: Cardiovascular Disease

## 2014-09-16 DIAGNOSIS — I517 Cardiomegaly: Secondary | ICD-10-CM | POA: Insufficient documentation

## 2014-09-16 DIAGNOSIS — I10 Essential (primary) hypertension: Secondary | ICD-10-CM | POA: Insufficient documentation

## 2014-09-16 DIAGNOSIS — R002 Palpitations: Secondary | ICD-10-CM | POA: Diagnosis present

## 2014-09-16 DIAGNOSIS — J9811 Atelectasis: Secondary | ICD-10-CM | POA: Diagnosis not present

## 2014-09-16 DIAGNOSIS — I712 Thoracic aortic aneurysm, without rupture: Secondary | ICD-10-CM | POA: Insufficient documentation

## 2014-09-16 DIAGNOSIS — I159 Secondary hypertension, unspecified: Secondary | ICD-10-CM

## 2014-09-16 DIAGNOSIS — R590 Localized enlarged lymph nodes: Secondary | ICD-10-CM | POA: Insufficient documentation

## 2014-09-16 MED ORDER — IOHEXOL 350 MG/ML SOLN
100.0000 mL | Freq: Once | INTRAVENOUS | Status: AC | PRN
  Administered 2014-09-16: 100 mL via INTRAVENOUS

## 2014-09-18 ENCOUNTER — Telehealth: Payer: Self-pay

## 2014-09-18 ENCOUNTER — Telehealth: Payer: Self-pay | Admitting: *Deleted

## 2014-09-18 NOTE — Telephone Encounter (Signed)
S/w pt who states he doesn't understand why he hasn't been called with the test results of  "all the tests you guys ordered" Reviewed which two tests were ordered by physician. Informed pt that I will notify Dr. Acie Fredrickson of CT angiography results in the system, ask him to read results and call patient back. Pt insists he needs test results before he can have labs drawn. Informed patient that per Dr. Acie Fredrickson, he can have labs drawn (BMET) 2-3 weeks after beginning K+ and lasix.  Per conversation on 8/19 with pt and wife, pt prefers labs drawn at [facility] or cancer center. At that time, copy of lab order was given to patient.  Reminded pt that he has lab order and all he needs to do is go to either place to have labs. Pt continued to tell me we had not done our job and "do I need to do everything for you. This is why I'm going to go to [facility]. Why can't you get it right?". Continued by indicating multiple times that we were incompetent. I asked patient what questions I could answer for him. He responded "Well, I can see where this conversation is going" and he hung up the phone.  Forward CT angiography results to Dr. Acie Fredrickson.

## 2014-09-18 NOTE — Telephone Encounter (Signed)
Pt wife calling wanted to clarify.  Pt told her that he won't get CT results unless pt does lab work Per Ardeth Sportsman, this is incorrect. As stated below.  We are waiting on The results to be read by Dr Acie Fredrickson After 2 w of starting K+ and lasix then pt is to go get labs drawn.  They are two different messages she had but was glad we could clarify it.  She was very appreciative about this and she understood.

## 2014-09-18 NOTE — Telephone Encounter (Signed)
Pt called, states he needs to know his results from the tests Dr. Acie Fredrickson has ordered. States he cannot get his bloodwork done until he know the results. States also his port needs to be flushed. Please call. Pt was not very pleasant. Please call back as soon as possible.

## 2014-09-18 NOTE — Telephone Encounter (Signed)
Patient's port not due to be flushed again until 8/30, if he needs labs before that date, he will need to have it drawn from arm at another facility. This was per discussion with Maxie Barb, RN. I called to speak with pt, but he was not available so his wife was given the message and was told to have hm call us back to schedule his port flush for 8/30 or afterwards. She said she will tell him. She said that he nneds to have labs before he can get results of some testing he had done with Dr Acie Fredrickson

## 2014-09-18 NOTE — Telephone Encounter (Addendum)
Reports that he needs labs drawn for Dr Acie Fredrickson and wants his labs drawn from his port. He was very demanding and did not want to answer questions and stated "Do I need to go to [facility] to get things done?" "why doesn't anyone want to help me/?" I explained that I was trying to help him, but needed some information form him in order to do so. He replied "well then I'll just shut my mouth"  I asked if he knew what was needing to be drawn, he replied" No, it is in your system" I explained that Dr Acie Fredrickson is not in our office anad asked if he has a signed order form Dr Acie Fredrickson. Again he replied " No, why can't you people just do it" He asked that we call his cardiologist office to see what he needs to have drawn. I told him I would have to do some investigating and get back to him.

## 2014-09-18 NOTE — Telephone Encounter (Signed)
Forward to [employer]

## 2014-09-18 NOTE — Telephone Encounter (Signed)
I called home number and did not get an anser I called cell and left voice mail CT angio was normal - no evidence of pulmonary embolus. PFTs are not still not intrepreted. But they look pretty good   I think the next best step is to refer for [facility]  We can set this up for next week. Have him come in for labs this week if he would like

## 2014-09-19 NOTE — Telephone Encounter (Signed)
Please call with CT results if not already done Thanks

## 2014-09-19 NOTE — Telephone Encounter (Signed)
S/w pt wife Jolayne Haines regarding results and Dr. Elmarie Shiley recommendations. Informed her that Dr. Haroldine Laws can perform left and right heart cath 9/1 at [facility]. Pt wife verbalized understanding and states she will discuss this and call back

## 2014-09-22 NOTE — Telephone Encounter (Signed)
S/w pt who called to inform us that he does not want heart cath. Does not give reason, just states he is not going to have it done. Will forward to Dr. Acie Fredrickson to make him aware.

## 2014-09-22 NOTE — Telephone Encounter (Signed)
Noted,

## 2014-10-01 ENCOUNTER — Telehealth: Payer: Self-pay | Admitting: Cardiovascular Disease

## 2014-10-01 ENCOUNTER — Telehealth: Payer: Self-pay

## 2014-10-01 NOTE — Telephone Encounter (Signed)
Forward results to pulmonary for review

## 2014-10-01 NOTE — Telephone Encounter (Signed)
Can you show the PFTs to office MD and have them interpert.  Thank you.

## 2014-10-01 NOTE — Telephone Encounter (Signed)
Patient wants Dr. Acie Fredrickson to know he had labs drawn at PCP and he can review them on [facility])

## 2014-10-02 NOTE — Telephone Encounter (Signed)
Spirometry The FVC was 2.5 L which was 58% of predicted. The FEV1 was 1.85 L which was 60% of predicted. The FEV to FVC ratio was 74%. There was no significant improvement with bronchodilator therapy.  Lung volumes. The residual volume was 2.09 L which was 76% of predicted. Total lung capacity was 4.5 L, which was 61% of predicted. The RV/TLC ratio was 46, which was 119% of predicted.  The diffusion capacity was 46% of predicted.   Impression: Overall, this test results is consistent with restrictive lung disease, there is does not appear to be evidence of significant obstructive lung disease. Given the poor reproducibility on testing patient's restriction could be due to inadequate ability to fully perform the maneuvers required for the test.

## 2014-10-02 NOTE — Telephone Encounter (Signed)
Please see Mungal's note to interpret. Thanks

## 2014-10-02 NOTE — Telephone Encounter (Signed)
S/w Miquel Dunn at Dr. Abbe Amsterdam office who states pt had CBC drawn on 8/30. No labs since then. Will call pt regarding BMET

## 2014-10-02 NOTE — Telephone Encounter (Signed)
S/w Dr. Elzie Rings office, to request copy of 9/7 labs. [place] office to send message to nurse. Gave our fax number for results to be sent.

## 2014-10-03 ENCOUNTER — Ambulatory Visit: Payer: PRIVATE HEALTH INSURANCE | Admitting: Cardiovascular Disease

## 2014-10-03 NOTE — Telephone Encounter (Signed)
This is the interpretation of pt's PFT per Dr. Ashby Dawes. Will forward this back to [facility] in [place].

## 2014-10-03 NOTE — Telephone Encounter (Signed)
Left message on machine for patient to contact the office regarding PFT results and BMET to be drawn

## 2014-10-08 NOTE — Telephone Encounter (Signed)
Left message on machine for patient to contact the office.   

## 2014-10-09 DIAGNOSIS — Z23 Encounter for immunization: Secondary | ICD-10-CM | POA: Insufficient documentation

## 2014-10-09 NOTE — Telephone Encounter (Signed)
Attempted to contact pt.  No answer, vm not set up . Letter mailed.

## 2014-10-14 NOTE — Telephone Encounter (Signed)
Reviewed PFT results w/ pt wife, Jolayne Haines. Discussed need for pt to have BMET. Wife states pt has had labs drawn at [facility] but is unsure what labs they were. Informed wife I will contact [place] and if he needs BMET, I will call her.

## 2014-10-14 NOTE — Telephone Encounter (Signed)
S/w pt wife who states she is sure he has had  labs drawn since 09/04/14 but unsure if it was BMET. States she will call his doctor's office to inquire and have them faxed to [place] if

## 2014-10-14 NOTE — Telephone Encounter (Signed)
S/w pt wife, [name], who states that pt stopped taking lasix and potassium approximately 5 days after it was prescribed on Aug 17. Per wife, pt  "couldn't stay out of the bathroom and this was no way to live" Wife states that since he has stopped taking med and because he has been "stuck so much", they will not have the BMET drawn. Confirmed 9/28 OV. Pt wife had no further questions.

## 2014-10-14 NOTE — Telephone Encounter (Signed)
Patient did not have bmet at [place] .  They did cbc only.  Please call wife

## 2014-10-14 NOTE — Telephone Encounter (Signed)
Pt is calling back, after receiving his letter, to get lab results

## 2014-10-22 ENCOUNTER — Encounter: Payer: Self-pay | Admitting: Cardiovascular Disease

## 2014-10-22 ENCOUNTER — Ambulatory Visit (INDEPENDENT_AMBULATORY_CARE_PROVIDER_SITE_OTHER): Payer: Medicare Other | Admitting: Cardiovascular Disease

## 2014-10-22 VITALS — BP 130/40 | HR 72 | Ht 71.0 in | Wt 182.5 lb

## 2014-10-22 DIAGNOSIS — I4891 Unspecified atrial fibrillation: Secondary | ICD-10-CM | POA: Diagnosis not present

## 2014-10-22 NOTE — Progress Notes (Signed)
Cardiology Office Note   Date:  10/22/2014   ID:  [name], DOB Jul 12, 1932, MRN [id number]  PCP:  Marcello Fennel, MD  Cardiologist:   Thayer Headings, MD   Chief Complaint  Patient presents with  . OTHER    1 month f/u c/o fatigue, rash and itching. Meds reviewed verbally with pt.   Problem list: 1. CLL 2. Premature ventricular contractions 3.  History of atrial fibrillation-status post A. fib ablation ( Dr. Irineo Axon, [place], 2007)  4.  Essential hypertension 5. Pulmonary hypertension       History of Present Illness: [name] is a 81 y.o. male who presents for evaluation of PVCs. He was diagnosed with CLL many years ago. Became a problem in 2013 Started chemotherapy in 2014.   Started on Revlimid 2 months ago,   Started having palpitations since starting Revlimid Has chronic fatigue, weakness, Has started ankle swelling - started several months ago , had a venous duplex scan about 2 years ago .  Which  Is reportedly normal .  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 He has done some physical therapy  Former smoker , Drank in the past  Aug. 17, 2016:  Pt was seen today to follow up with his echo that revealed moderate pulmonary HTN Est. PA pressure of 55 mm Hg  Has had shortness of breath and leg swelling for several months .   10/22/2014:  Mr. [name] had pulmonary function tests showed reduction of his diffusion capacity - 46% predicted and restrictive lung disease.   He had a CT angiogram of the chest which was ordered as an aortic dissection study. There is no evidence of aortic dissection. The pulmonary arteries were not opacified completely but there were certainly no large central pulmonary emboli. He was started on Lasix and potassium.  Caused him to urinate all day .  He stopped it after 1 week.  Could not tell that his breathing was  any better.   Currently, he is breathing well and is not having any heart palpitations.      Past Medical History  Diagnosis Date  . Atrial fibrillation 2007    s/p ablation  . Glaucoma   . Hypertension   . BPH (benign prostatic hypertrophy) 1989    s/p TURP  . History of chicken pox   . Microhematuria 1994    longstanding per pt, states normal w/u in past (has had cystoscopy, CT)  . Diastolic CHF 01/2749    grade 1  . Diverticulosis of colon (without mention of hemorrhage)   . Colon polyps 06/2012    multiple tubular adenomas, rec rpt 1 yr Sharlett Iles)  . CLL (chronic lymphocytic leukemia)     Past Surgical History  Procedure Laterality Date  . Hernia repair  x6    inguinal, ventral  . Cataract extraction, bilateral    . Umbilical hernia repair      strangulated  . Radiofrequency ablation  2007    Afib  . Transurethral resection of prostate  1989    BPH  . Colonoscopy  06/2008    no polyps, rec rpt 4-5 yrs given hx polyps (done in [facility])  . US echocardiography  04/2012    EF 55-60%, normal systol fxn, grade 1 diastolic dysfunction, mildly dilated LA, mild pulm HTN  . Carotid US  04/2012    mild plaque bilaterally (0-39%)  . Colonoscopy  06/2012    adenomatous polyp, severe diverticulosis, rec rpt 1 yr Sharlett Iles)  . Esophagogastroduodenoscopy  06/2012    duodenal divertic, o/w WNL Sharlett Iles)  . Cardiac catheterization  2007     Current Outpatient Prescriptions  Medication Sig Dispense Refill  . Ascorbic Acid (VITAMIN C) 1000 MG tablet Take 1,000 mg by mouth daily.    Marland Kitchen aspirin 81 MG tablet Take 81 mg by mouth daily.    . dapsone 100 MG tablet Take 100 mg by mouth daily.    . DiphenhydrAMINE HCl (BENADRYL ALLERGY PO) Take by mouth at bedtime.     . docusate sodium (COLACE) 100 MG capsule Take 100 mg by mouth 2 (two) times daily.    . ferrous sulfate 325 (65 FE) MG tablet Take 325 mg by mouth 3 (three) times daily with meals.     . hydrOXYzine (ATARAX/VISTARIL)  25 MG tablet Take 25 mg by mouth as needed.    . loratadine (CLARITIN) 10 MG tablet Take 10 mg by mouth daily.    . predniSONE (DELTASONE) 10 MG tablet Take 10 mg by mouth daily with breakfast.    . psyllium (REGULOID) 0.52 G capsule Take 0.52 g by mouth daily.     No current facility-administered medications for this visit.   Facility-Administered Medications Ordered in Other Visits  Medication Dose Route Frequency Provider Last Rate Last Dose  . sodium chloride 0.9 % injection 10 mL  10 mL Intravenous PRN Leia Alf, MD   10 mL at 08/20/14 1120    Allergies:   Rituximab; Penicillin g potassium in d5w; and Penicillins    Social History:  The patient  reports that he quit smoking about 21 years ago. His smoking use included Cigarettes. He started smoking about 66 years ago. He has a 40 pack-year smoking history. He has never used smokeless tobacco. He reports that he does not drink alcohol or use illicit drugs.   Family History:  The patient's family history includes Cancer in his father; Diabetes in his brother; Stroke in his mother. There is no history of CAD. He was adopted.    ROS:  Please see the history of present illness.    Review of Systems: Constitutional:  denies fever, chills, diaphoresis, appetite change and fatigue.  HEENT: denies photophobia, eye pain, redness, hearing loss, ear pain, congestion, sore throat, rhinorrhea, sneezing, neck pain, neck stiffness and tinnitus.  Respiratory: denies SOB, DOE, cough, chest tightness, and wheezing.  Cardiovascular: admits to  palpitations and leg swelling.  Gastrointestinal: denies nausea, vomiting, abdominal pain, diarrhea, constipation, blood in stool.  He has a poor appetite.   Genitourinary: denies dysuria, urgency, frequency, hematuria, flank pain and difficulty urinating.  Musculoskeletal: denies  myalgias, back pain, joint swelling, arthralgias and gait problem.   Skin: denies pallor, rash and wound.  Neurological:  denies dizziness, seizures, syncope, weakness, light-headedness, numbness and headaches.   Hematological: denies adenopathy, easy bruising, personal or family bleeding history.  Psychiatric/ Behavioral: denies suicidal ideation, mood changes, confusion, nervousness, sleep disturbance and agitation.       All other systems are reviewed and negative.    PHYSICAL EXAM: VS:  BP 130/40 mmHg  Pulse 72  Ht 5\' 11"  (1.803 m)  Wt 82.781 kg (182 lb 8 oz)  BMI 25.46 kg/m2 , BMI Body mass index is 25.46 kg/(m^2). GEN: Well nourished, well developed, in no acute distress HEENT: normal Neck: no JVD, carotid bruits,  or masses Cardiac: RR wjith frequent premature beats; 1-2 / 6 systolic murmur, rubs, or gallops, 2-3 + soft  edema  Respiratory:  clear to auscultation bilaterally, normal work of breathing GI: soft, nontender, nondistended, + BS MS: no deformity or atrophy Skin: warm and dry, no rash Neuro:  Strength and sensation are intact Psych: normal   EKG:  EKG is ordered today. NSR at 72. No ST or T wave abn.   Recent Labs: 08/28/2014: Hemoglobin 8.8*; Platelets 332 08/29/2014: ALT 12*; BUN 14; Creatinine, Ser 0.66; Potassium 3.7; Sodium 137    Lipid Panel    Component Value Date/Time   CHOL 95 11/02/2011   TRIG 44 11/02/2011   HDL 58 11/02/2011   LDLDIRECT 28 11/02/2011      Wt Readings from Last 3 Encounters:  10/22/14 82.781 kg (182 lb 8 oz)  09/10/14 80.967 kg (178 lb 8 oz)  08/29/14 83.348 kg (183 lb 12 oz)      Other studies Reviewed: Additional studies/ records that were reviewed today include: . Review of the above records demonstrates:    ASSESSMENT AND PLAN:  1.  Palpitations: The patient has evidence of premature atrial contractions and premature ventricular contractions. He has many reasons that he might have these heartbeat irregular disease. He has chronic lymphocytic leukemia. He somewhat malnourished. He may have a lateral light abdomen allergies.    2.  Leg edema. He's had chronic leg edema. He has multiple reasons for this leg edema including anemia, low albumin.   We now found that he has pulmonary hypertension with an estimated PA pressure of 56 - Left ventricle: The cavity size was mildly dilated. There was mild concentric hypertrophy. Systolic function was normal. The estimated ejection fraction was in the range of 60% to 65%. Wall motion was normal; there were no regional wall motion abnormalities. Left ventricular diastolic function parameters were normal. - Mitral valve: There was mild regurgitation. - Left atrium: The atrium was mildly dilated. - Right ventricle: Systolic function was normal. - Pulmonary arteries: Systolic pressure was moderately elevated. PA peak pressure: 56 mm Hg  3.  Pulmonary Hypertension: He now been found to have pulmonary  hypertension. PFTs show restrictive lung disease with reduction of diffusion capacity ( 46%)  I suspect this is the cause of his pulmonary HTN.  Will have him see Pulmonary medicine . CT angio did not reveal any evidence of pulmonary embolus    4. Hx of Atrial fib:  S/p ablation , has remained in NSR     Current medicines are reviewed at length with the patient today.  The patient does not have concerns regarding medicines.  The following changes have been made:  no change  Labs/ tests ordered today include:   Orders Placed This Encounter  Procedures  . EKG 12-Lead     Disposition:   FU with [place] as needed.   He is being referred to pulmonary medicine .    Nahser, Wonda Cheng, MD  10/22/2014 2:42 PM    [facility] [place], [place], [place]  [address] Phone: [phone]; Fax: [phone]   [facility]  [address] [place], [place]  [address] [phone]   Fax [phone]

## 2014-10-22 NOTE — Patient Instructions (Signed)
Medication Instructions:  Your physician recommends that you continue on your current medications as directed. Please refer to the Current Medication list given to you today.   Labwork: none  Testing/Procedures: none  Follow-Up: Your physician recommends that you schedule a follow-up appointment as needed.   Any Other Special Instructions Will Be Listed Below (If Applicable).

## 2014-11-10 ENCOUNTER — Ambulatory Visit: Payer: Medicare Other | Admitting: Pulmonary Disease

## 2014-11-27 ENCOUNTER — Encounter: Payer: Self-pay | Admitting: Pulmonary Disease

## 2014-11-27 ENCOUNTER — Ambulatory Visit (INDEPENDENT_AMBULATORY_CARE_PROVIDER_SITE_OTHER): Payer: Medicare Other | Admitting: Pulmonary Disease

## 2014-11-27 VITALS — BP 130/72 | HR 94 | Ht 71.5 in | Wt 183.6 lb

## 2014-11-27 DIAGNOSIS — R0600 Dyspnea, unspecified: Secondary | ICD-10-CM | POA: Diagnosis not present

## 2014-11-27 DIAGNOSIS — J90 Pleural effusion, not elsewhere classified: Secondary | ICD-10-CM | POA: Diagnosis not present

## 2014-11-27 DIAGNOSIS — R942 Abnormal results of pulmonary function studies: Secondary | ICD-10-CM

## 2014-11-27 DIAGNOSIS — R5383 Other fatigue: Secondary | ICD-10-CM | POA: Diagnosis not present

## 2014-11-27 DIAGNOSIS — R600 Localized edema: Secondary | ICD-10-CM

## 2014-11-27 DIAGNOSIS — I272 Other secondary pulmonary hypertension: Secondary | ICD-10-CM | POA: Diagnosis not present

## 2014-11-27 NOTE — Progress Notes (Signed)
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 Also has history of PAF and underwent ablation therapy for that. Followed by Cardiology. Recently underwent evaluation for dyspnea which included multiple investigations noted below. Most significantly, he had echocardiogram demonstrating pulmonary hypertension and PFTs demonstrating restrictive physiology. For these findings, he is referred to me for further evaluation. Regarding dyspnea, he states that he "barely notices it" and indicates that he is more limited by general fatigue. Overall, he is extremely weak and ambulates minimally in the home. He denies any other respiratory or chest symptoms including CP, cough, sputum production and hemoptysis. He also denies fever. He does have rather severe cold intolerance and comes to the office wearing two layers of clothes on this very warm day. He is a former smoker having quit in 1995 and has never been given a diagnosis of COPD or other smoking related lung diseases. Notably, he believes the above symptoms have worsened since starting Revlimid.  Past Medical History  Diagnosis Date  . Atrial fibrillation ([place]) 2007    s/p ablation  . Glaucoma   . Hypertension   . BPH (benign prostatic hypertrophy) 1989    s/p TURP  . History of chicken pox   . Microhematuria 1994    longstanding per pt, states normal w/u in past (has had cystoscopy, CT)  . Diastolic CHF ([place]) 09/6220    grade 1  . Diverticulosis of colon (without mention of hemorrhage)   . Colon polyps 06/2012    multiple tubular adenomas, rec rpt 1 yr Sharlett Iles)  . CLL (chronic lymphocytic leukemia) ([place])    Past Surgical History   Procedure Laterality Date  . Hernia repair  x6    inguinal, ventral  . Cataract extraction, bilateral    . Umbilical hernia repair      strangulated  . Radiofrequency ablation  2007    Afib  . Transurethral resection of prostate  1989    BPH  . Colonoscopy  06/2008    no polyps, rec rpt 4-5 yrs given hx polyps (done in [facility])  . US echocardiography  04/2012    EF 55-60%, normal systol fxn, grade 1 diastolic dysfunction, mildly dilated LA, mild pulm HTN  . Carotid US  04/2012    mild plaque bilaterally (0-39%)  . Colonoscopy  06/2012    adenomatous polyp, severe diverticulosis, rec rpt 1 yr Sharlett Iles)  . Esophagogastroduodenoscopy  06/2012    duodenal divertic, o/w WNL Sharlett Iles)  . Cardiac catheterization  2007    MEDICATIONS: reviewed  Social History   Social History  . Marital Status: Married    Spouse Name: N/A  . Number of Children: N/A  . Years of Education: N/A   Occupational History  . retired    Social History Main Topics  . Smoking status: Former Smoker -- 1.00 packs/day for 40 years    Types: Cigarettes    Start date: 01/25/1948    Quit date: 01/24/1993  . Smokeless tobacco: Never Used  . Alcohol Use: No     Comment: Regular (2-3 drinks wine/day)  . Drug Use: No  . Sexual Activity: Not on file   Other Topics Concern  . Not on file   Social History Narrative  Left family at age 78 yo.   Lives with wife.  Grown stepdaughter.   Occupation: retired, was Art gallery manager   Edu: Bachelor's   Activity: no regular exercise.  taking online [employer] course.   Diet: good water, fruits/vegetables daily.    Family History  Problem Relation Age of Onset  . Adopted: Yes  . Cancer Father     lung, smoker  . Stroke Mother   . Diabetes Brother   . CAD Neg Hx     ROS - as per HPI. Otherwise, negative  Filed Vitals:   11/27/14 1110  BP: 130/72  Pulse: 94  Height: 5' 11.5" (1.816 m)  Weight: 183 lb 9.6 oz (83.28 kg)  SpO2: 94%    EXAM:  Gen:  Frail, pale, no overt respiratory distress HEENT: scleral pallor, otherwise WNL Neck: no JVD or LAN noted Lungs: normal percussion note throughout, mildly diminished BS without adventitious sounds Cardiovascular: RRR, no M Abdomen: soft, NT, +BS Ext: 4+ symmetric edema, pitting Neuro: no focal deficits, generally weak with difficulty stepping up to exam table   BMP Latest Ref Rng 08/29/2014 05/09/2014 05/07/2014  Glucose 65 - 99 mg/dL 101(H) - -  BUN 6 - 20 mg/dL 14 - -  Creatinine 0.61 - 1.24 mg/dL 0.66 - 0.81  Sodium 135 - 145 mmol/L 137 - -  Potassium 3.5 - 5.1 mmol/L 3.7 3.6 -  Chloride 101 - 111 mmol/L 99(L) - -  CO2 22 - 32 mmol/L 29 - -  Calcium 8.9 - 10.3 mg/dL 8.5(L) - -    CBC Latest Ref Rng 08/28/2014 08/20/2014 08/12/2014  WBC 3.8 - 10.6 K/uL 10.0 7.8 11.6(H)  Hemoglobin 13.0 - 18.0 g/dL 8.8(L) 8.2(L) 8.4(L)  Hematocrit 40.0 - 52.0 % 27.1(L) 25.7(L) 26.8(L)  Platelets 150 - 440 K/uL 332 339 346    PFTs: moderate restrictive pattern with moderate reduction in DLCO but poor quality study  CT chest 09/16/14:  Increased right pleural fluid with compressive atelectasis at the right lung base. Slightly increased densities in the lingula could represent areas of volume loss or subtle inflammatory process.  Increased lymphadenopathy in the upper mediastinum and right axilla. Findings suggest progression of the neoplastic process  Echocardiogram 09/05/14:  - Left ventricle: The cavity size was mildly dilated. There was mild concentric hypertrophy. Systolic function was normal. The estimated ejection fraction was in the range of 60% to 65%. Wall motion was normal; there were no regional wall motion abnormalities. Left ventricular diastolic function parameters were normal. - Mitral valve: There was mild regurgitation. - Left atrium: The atrium was mildly dilated. - Right ventricle: Systolic function was normal. - Pulmonary arteries: Systolic pressure was moderately  elevated. PA peak pressure: 56 mm Hg (S).   IMPRESSION: 1) Dyspnea - likely multifactorial and does not appear to be the limiting factor 2) Fatigue is his most profound symptom which is perceived to be worse after starting revlimid 3) Pulmonary hypertension - incidental finding on echocardiogram 4) Pleural effusion on recent CT chest. Not detectable by exam presently 5) Bilateral edema of lower extremity - likely multifactorial 6) Restrictive pattern present on pulmonary function testing - likely of no significant consequence 7) Frequent nocturnal arousals which he attributes to nocturia  DISCUSSION: Mr Leighty presents with multiple constitutional complaints, the most significant of which is fatigue, rather than dyspnea. He attributes much of his symptomatolgy to the medication revlimid which is being used to manage low grade B cell lymphoma. Of course, many of his symptoms might  be attributable to to the lymphoma itself. He was very frank (and reasonable) in his wish to avoid aggressive evaluation and treatment of any new problems noting the prognosis of the lymphoma unless such eval and treatment might offer improvement in his quality of life or palliation of symptoms. To that end, I do not think embarking on a costly and extensive evaluation of pulmonary hypertension is warranted. I suggested that we might simply perform overnight oximetry to assess for significant desaturations as nocturnal oxygen therapy, if indicated, might benefit sleep quality, pulmoanry hypertension and overall sense of well-being. Also, if pleural effusion has expanded significantly, a therapeutic thoracentesis might be warranted  PLAN: CXR ordered to re-assess R pleural effusion Overnight oximetry - if significant desaturations, will arrange nocturnal O2 ROV in 2 wks   Merton Border, MD PCCM service Mobile [phone] Pager [phone]

## 2014-12-01 ENCOUNTER — Encounter: Payer: Self-pay | Admitting: Pulmonary Disease

## 2014-12-08 ENCOUNTER — Telehealth: Payer: Self-pay | Admitting: *Deleted

## 2014-12-08 NOTE — Telephone Encounter (Signed)
Pt wife calling back to find out about the Oxygen test patient was to do  Please advise.

## 2014-12-11 ENCOUNTER — Encounter: Payer: Self-pay | Admitting: Pulmonary Disease

## 2014-12-11 ENCOUNTER — Ambulatory Visit (INDEPENDENT_AMBULATORY_CARE_PROVIDER_SITE_OTHER): Payer: Medicare Other | Admitting: Pulmonary Disease

## 2014-12-11 ENCOUNTER — Ambulatory Visit
Admission: RE | Admit: 2014-12-11 | Discharge: 2014-12-11 | Disposition: A | Discharge status: Home / Self Care | Payer: Medicare Other | Source: Ambulatory Visit | Attending: Pulmonary Disease | Admitting: Pulmonary Disease

## 2014-12-11 DIAGNOSIS — R0902 Hypoxemia: Secondary | ICD-10-CM

## 2014-12-11 DIAGNOSIS — C9110 Chronic lymphocytic leukemia of B-cell type not having achieved remission: Secondary | ICD-10-CM | POA: Insufficient documentation

## 2014-12-11 DIAGNOSIS — R918 Other nonspecific abnormal finding of lung field: Secondary | ICD-10-CM | POA: Insufficient documentation

## 2014-12-11 DIAGNOSIS — J90 Pleural effusion, not elsewhere classified: Secondary | ICD-10-CM | POA: Insufficient documentation

## 2014-12-11 DIAGNOSIS — I272 Other secondary pulmonary hypertension: Secondary | ICD-10-CM | POA: Diagnosis not present

## 2014-12-11 NOTE — Patient Instructions (Signed)
Wear oxygen at 2 liters per minute during sleep You may experiment with oxygen during the day to see if it helps with fatigue and shortness of breath There is a small collection of fluid around your right lung (between your lung and the chest wall). We will leave this alone for now Follow up in 4-6 weeks to review whether these interventions have been of any benefit

## 2014-12-12 NOTE — Progress Notes (Signed)
PROBLEMS: Pulmonary hypertension - likely Group 5 (PH due to unclear multifactorial mechanisms) Pleural effusions Restrictive pulmonary physiology Severe chronic LE edema Stage IV low grade lymphoma on Revlimid   SUBJ: Last visit we discussed the above problems and agreed that an extensive evaluation was not warranted in light of his diagnosis of lymphoma and relatively advanced age. We opted to focus our efforts on those things that might add to his sense of well being. To that end, I ordered overnight oximetry and repeat CXR to consider whether thoracentesis might be palliative. He returned feeling much improved as he is now further out from his last cycle of revlimid which seems to cause immense fatigue. He looks stronger and reports modest improvement in exercise tolerance. He is here to review results of overnight oximetry and CXR  OBJ: Filed Vitals:   12/11/14 1130  BP: 130/62  Pulse: 113  Height: 5' 11.5" (1.816 m)  Weight: 187 lb 12.8 oz (85.186 kg)  SpO2: 92%   EXAM:  Gen: Frail, pale, no overt respiratory distress HEENT: scleral pallor, otherwise WNL Neck: no JVD or LAN noted Lungs: normal percussion note throughout, mildly diminished BS without adventitious sounds Cardiovascular: Reg, no M Abdomen: soft, NT, +BS Ext: 4+ symmetric edema, pitting Neuro: no focal deficits  DATA: CXR: relatively small R>L effusions, perhaps mild edema pattern Overnight oximetry: significant desaturation through most of night with low SpO2 in mid 70s and most of night below 88%  IMPRESSION: Pulmonary hypertension - regarding as group V in absence of complete evaluation Bilateral relatively small pleural effusions - thoracentesis is not likely to offer much symptomatic benefit Chronic fatigue and dyspnea - multifactorial Severe LE edema - in part due to [facility] Nocturnal hypoxemia - likely a contributor to [facility]  PLAN: Begin nocturnal O2 @ 2 lpm [place] No thoracentesis for now Follow up in 4-6  weeks   Wilhelmina Mcardle, MD [facility] [place] Pulmonary/CCM

## 2014-12-29 ENCOUNTER — Telehealth: Payer: Self-pay | Admitting: *Deleted

## 2014-12-29 DIAGNOSIS — J449 Chronic obstructive pulmonary disease, unspecified: Secondary | ICD-10-CM

## 2014-12-29 NOTE — Telephone Encounter (Signed)
Pt calling stating he would like [place] to cancel the machine in his home for oxygen States all it does is give him nose bleeds.  Please call patient.  He was a bit upset.

## 2014-12-29 NOTE — Telephone Encounter (Signed)
LM on VM to call back,

## 2014-12-30 NOTE — Telephone Encounter (Signed)
His decision to return O2 is noted.   Merton Border, MD PCCM service Mobile [phone] Pager [phone]

## 2014-12-30 NOTE — Telephone Encounter (Signed)
Called pt to f/u on call. Pt states he is returning the O2. States he is not going to use it. When I asked him why he said it doesn't matter.   FYI for you.

## 2015-01-01 NOTE — Telephone Encounter (Signed)
Noted  [name] 

## 2015-01-09 ENCOUNTER — Ambulatory Visit: Payer: Medicare Other | Admitting: Pulmonary Disease

## 2015-01-12 ENCOUNTER — Other Ambulatory Visit: Payer: Self-pay | Admitting: Internal Medicine

## 2015-01-12 DIAGNOSIS — C8308 Small cell B-cell lymphoma, lymph nodes of multiple sites: Secondary | ICD-10-CM

## 2015-01-27 ENCOUNTER — Encounter
Admission: RE | Admit: 2015-01-27 | Discharge: 2015-01-27 | Disposition: A | Discharge status: Home / Self Care | Payer: Medicare Other | Source: Ambulatory Visit | Attending: Internal Medicine | Admitting: Internal Medicine

## 2015-01-27 DIAGNOSIS — C8308 Small cell B-cell lymphoma, lymph nodes of multiple sites: Secondary | ICD-10-CM | POA: Diagnosis present

## 2015-01-27 LAB — GLUCOSE, CAPILLARY: Glucose-Capillary: 79 mg/dL (ref 65–99)

## 2015-01-27 NOTE — Addendum Note (Signed)
Addended by: Oscar La R on: 01/27/2015 12:31 PM   Modules accepted: Orders

## 2015-01-27 NOTE — Telephone Encounter (Signed)
Please send d/c order to h/h company so they will pick up oxygen .  Patient no longer has sob and doesn't need o2.

## 2015-01-27 NOTE — Telephone Encounter (Signed)
Order placed to have O2 picked up per pt request. Nothing further needed.

## 2015-01-28 MED ORDER — FLUDEOXYGLUCOSE F - 18 (FDG) INJECTION
12.3300 | Freq: Once | INTRAVENOUS | Status: AC | PRN
  Administered 2015-01-27: 12.33 via INTRAVENOUS

## 2015-01-29 DIAGNOSIS — Z9229 Personal history of other drug therapy: Secondary | ICD-10-CM | POA: Insufficient documentation

## 2015-02-24 ENCOUNTER — Emergency Department: Payer: Medicare Other

## 2015-02-24 ENCOUNTER — Inpatient Hospital Stay
Admission: EM | Admit: 2015-02-24 | Discharge: 2015-02-26 | DRG: 516 | Disposition: A | Discharge status: Skilled Nursing Facility | Payer: Medicare Other | Attending: Internal Medicine | Admitting: Internal Medicine

## 2015-02-24 ENCOUNTER — Encounter: Payer: Self-pay | Admitting: *Deleted

## 2015-02-24 ENCOUNTER — Inpatient Hospital Stay: Payer: Medicare Other

## 2015-02-24 DIAGNOSIS — Z7952 Long term (current) use of systemic steroids: Secondary | ICD-10-CM | POA: Diagnosis not present

## 2015-02-24 DIAGNOSIS — Z87891 Personal history of nicotine dependence: Secondary | ICD-10-CM

## 2015-02-24 DIAGNOSIS — D649 Anemia, unspecified: Secondary | ICD-10-CM | POA: Diagnosis present

## 2015-02-24 DIAGNOSIS — M549 Dorsalgia, unspecified: Secondary | ICD-10-CM | POA: Diagnosis present

## 2015-02-24 DIAGNOSIS — R609 Edema, unspecified: Secondary | ICD-10-CM | POA: Diagnosis present

## 2015-02-24 DIAGNOSIS — C8590 Non-Hodgkin lymphoma, unspecified, unspecified site: Secondary | ICD-10-CM | POA: Diagnosis present

## 2015-02-24 DIAGNOSIS — I248 Other forms of acute ischemic heart disease: Secondary | ICD-10-CM | POA: Diagnosis present

## 2015-02-24 DIAGNOSIS — L89151 Pressure ulcer of sacral region, stage 1: Secondary | ICD-10-CM | POA: Diagnosis present

## 2015-02-24 DIAGNOSIS — Z79899 Other long term (current) drug therapy: Secondary | ICD-10-CM

## 2015-02-24 DIAGNOSIS — J9811 Atelectasis: Secondary | ICD-10-CM | POA: Diagnosis present

## 2015-02-24 DIAGNOSIS — S32000A Wedge compression fracture of unspecified lumbar vertebra, initial encounter for closed fracture: Secondary | ICD-10-CM

## 2015-02-24 DIAGNOSIS — M4856XA Collapsed vertebra, not elsewhere classified, lumbar region, initial encounter for fracture: Principal | ICD-10-CM | POA: Diagnosis present

## 2015-02-24 DIAGNOSIS — I4891 Unspecified atrial fibrillation: Secondary | ICD-10-CM | POA: Diagnosis present

## 2015-02-24 DIAGNOSIS — R000 Tachycardia, unspecified: Secondary | ICD-10-CM | POA: Diagnosis present

## 2015-02-24 DIAGNOSIS — I5032 Chronic diastolic (congestive) heart failure: Secondary | ICD-10-CM | POA: Diagnosis present

## 2015-02-24 DIAGNOSIS — Z880 Allergy status to penicillin: Secondary | ICD-10-CM | POA: Diagnosis not present

## 2015-02-24 DIAGNOSIS — H409 Unspecified glaucoma: Secondary | ICD-10-CM | POA: Diagnosis present

## 2015-02-24 DIAGNOSIS — Z888 Allergy status to other drugs, medicaments and biological substances status: Secondary | ICD-10-CM

## 2015-02-24 DIAGNOSIS — Z419 Encounter for procedure for purposes other than remedying health state, unspecified: Secondary | ICD-10-CM

## 2015-02-24 DIAGNOSIS — Z86010 Personal history of colonic polyps: Secondary | ICD-10-CM

## 2015-02-24 DIAGNOSIS — R52 Pain, unspecified: Secondary | ICD-10-CM

## 2015-02-24 DIAGNOSIS — I10 Essential (primary) hypertension: Secondary | ICD-10-CM | POA: Diagnosis present

## 2015-02-24 DIAGNOSIS — L8990 Pressure ulcer of unspecified site, unspecified stage: Secondary | ICD-10-CM | POA: Insufficient documentation

## 2015-02-24 DIAGNOSIS — J811 Chronic pulmonary edema: Secondary | ICD-10-CM | POA: Diagnosis present

## 2015-02-24 DIAGNOSIS — Z7982 Long term (current) use of aspirin: Secondary | ICD-10-CM

## 2015-02-24 DIAGNOSIS — R0902 Hypoxemia: Secondary | ICD-10-CM

## 2015-02-24 HISTORY — DX: Pruritus, unspecified: L29.9

## 2015-02-24 HISTORY — DX: Anemia, unspecified: D64.9

## 2015-02-24 LAB — CBC WITH DIFFERENTIAL/PLATELET
BASOS ABS: 0.1 10*3/uL (ref 0–0.1)
BASOS PCT: 1 %
Eosinophils Absolute: 0 10*3/uL (ref 0–0.7)
Eosinophils Relative: 0 %
HEMATOCRIT: 21.7 % — AB (ref 40.0–52.0)
HEMOGLOBIN: 7 g/dL — AB (ref 13.0–18.0)
Lymphocytes Relative: 4 %
Lymphs Abs: 0.4 10*3/uL — ABNORMAL LOW (ref 1.0–3.6)
MCH: 31.6 pg (ref 26.0–34.0)
MCHC: 32.1 g/dL (ref 32.0–36.0)
MCV: 98.3 fL (ref 80.0–100.0)
Monocytes Absolute: 1.3 10*3/uL — ABNORMAL HIGH (ref 0.2–1.0)
Monocytes Relative: 14 %
NEUTROS ABS: 7.8 10*3/uL — AB (ref 1.4–6.5)
NEUTROS PCT: 81 %
Platelets: 205 10*3/uL (ref 150–440)
RBC: 2.2 MIL/uL — ABNORMAL LOW (ref 4.40–5.90)
RDW: 19.9 % — AB (ref 11.5–14.5)
WBC: 9.6 10*3/uL (ref 3.8–10.6)

## 2015-02-24 LAB — COMPREHENSIVE METABOLIC PANEL
ALBUMIN: 2.5 g/dL — AB (ref 3.5–5.0)
ALK PHOS: 85 U/L (ref 38–126)
ALT: 13 U/L — ABNORMAL LOW (ref 17–63)
AST: 13 U/L — AB (ref 15–41)
Anion gap: 7 (ref 5–15)
BILIRUBIN TOTAL: 1 mg/dL (ref 0.3–1.2)
BUN: 23 mg/dL — AB (ref 6–20)
CO2: 32 mmol/L (ref 22–32)
Calcium: 8.7 mg/dL — ABNORMAL LOW (ref 8.9–10.3)
Chloride: 100 mmol/L — ABNORMAL LOW (ref 101–111)
Creatinine, Ser: 0.6 mg/dL — ABNORMAL LOW (ref 0.61–1.24)
GFR calc Af Amer: >60 mL/min (ref >60)
GFR calc non Af Amer: >60 mL/min (ref >60)
GLUCOSE: 101 mg/dL — AB (ref 65–99)
POTASSIUM: 3.5 mmol/L (ref 3.5–5.1)
Sodium: 139 mmol/L (ref 135–145)
TOTAL PROTEIN: 6.2 g/dL — AB (ref 6.5–8.1)

## 2015-02-24 LAB — ABO/RH: ABO/RH(D): O POS

## 2015-02-24 LAB — URINALYSIS COMPLETE WITH MICROSCOPIC (ARMC ONLY)
Bilirubin Urine: NEGATIVE
GLUCOSE, UA: NEGATIVE mg/dL
Hgb urine dipstick: NEGATIVE
NITRITE: NEGATIVE
PROTEIN: NEGATIVE mg/dL
Specific Gravity, Urine: 1.02 (ref 1.005–1.030)
pH: 6 (ref 5.0–8.0)

## 2015-02-24 LAB — TROPONIN I
TROPONIN I: 0.05 ng/mL — AB (ref <0.031)
Troponin I: 0.07 ng/mL — ABNORMAL HIGH (ref <0.031)

## 2015-02-24 LAB — PREPARE RBC (CROSSMATCH)

## 2015-02-24 LAB — BRAIN NATRIURETIC PEPTIDE: B NATRIURETIC PEPTIDE 5: 489 pg/mL — AB (ref 0.0–100.0)

## 2015-02-24 LAB — LACTIC ACID, PLASMA: LACTIC ACID, VENOUS: 0.6 mmol/L (ref 0.5–2.0)

## 2015-02-24 MED ORDER — FUROSEMIDE 40 MG PO TABS
40.0000 mg | ORAL_TABLET | ORAL | Status: DC
  Administered 2015-02-26: 40 mg via ORAL
  Filled 2015-02-24: qty 1

## 2015-02-24 MED ORDER — ONDANSETRON HCL 4 MG PO TABS: 4.0000 mg | ORAL_TABLET | Freq: Four times a day (QID) | ORAL | Status: DC | PRN

## 2015-02-24 MED ORDER — DIPHENHYDRAMINE HCL 25 MG PO TABS
25.0000 mg | ORAL_TABLET | Freq: Every evening | ORAL | Status: DC | PRN
  Filled 2015-02-24: qty 1

## 2015-02-24 MED ORDER — ONDANSETRON HCL 4 MG/2ML IJ SOLN
4.0000 mg | Freq: Once | INTRAMUSCULAR | Status: AC
  Administered 2015-02-24: 4 mg via INTRAVENOUS
  Filled 2015-02-24: qty 2

## 2015-02-24 MED ORDER — LIDOCAINE 5 % EX PTCH
1.0000 | MEDICATED_PATCH | CUTANEOUS | Status: DC
  Administered 2015-02-24: 1 via TRANSDERMAL
  Filled 2015-02-24 (×5): qty 1

## 2015-02-24 MED ORDER — HEPARIN SODIUM (PORCINE) 5000 UNIT/ML IJ SOLN
5000.0000 [IU] | Freq: Three times a day (TID) | INTRAMUSCULAR | Status: DC
  Administered 2015-02-24 – 2015-02-26 (×4): 5000 [IU] via SUBCUTANEOUS
  Filled 2015-02-24 (×4): qty 1

## 2015-02-24 MED ORDER — FERROUS SULFATE 325 (65 FE) MG PO TABS
325.0000 mg | ORAL_TABLET | Freq: Three times a day (TID) | ORAL | Status: DC
  Administered 2015-02-26 (×2): 325 mg via ORAL
  Filled 2015-02-24 (×2): qty 1

## 2015-02-24 MED ORDER — MORPHINE SULFATE (PF) 4 MG/ML IV SOLN
INTRAVENOUS | Status: AC
  Administered 2015-02-24: 4 mg via INTRAVENOUS
  Filled 2015-02-24: qty 1

## 2015-02-24 MED ORDER — POLYETHYLENE GLYCOL 3350 17 G PO PACK
17.0000 g | PACK | Freq: Every day | ORAL | Status: DC | PRN
Start: 2015-02-24 — End: 2015-02-26

## 2015-02-24 MED ORDER — HYDROXYZINE HCL 25 MG PO TABS
25.0000 mg | ORAL_TABLET | Freq: Three times a day (TID) | ORAL | Status: DC
  Administered 2015-02-24 – 2015-02-26 (×4): 25 mg via ORAL
  Filled 2015-02-24 (×4): qty 1

## 2015-02-24 MED ORDER — CLINDAMYCIN PHOSPHATE 900 MG/50ML IV SOLN
900.0000 mg | Freq: Once | INTRAVENOUS | Status: AC
  Administered 2015-02-25: 900 mg via INTRAVENOUS
  Filled 2015-02-24: qty 50

## 2015-02-24 MED ORDER — ONDANSETRON HCL 4 MG/2ML IJ SOLN: 4.0000 mg | Freq: Four times a day (QID) | INTRAMUSCULAR | Status: DC | PRN

## 2015-02-24 MED ORDER — LACTULOSE 10 GM/15ML PO SOLN
10.0000 g | Freq: Every day | ORAL | Status: DC
  Administered 2015-02-24 – 2015-02-26 (×2): 10 g via ORAL
  Filled 2015-02-24 (×2): qty 30

## 2015-02-24 MED ORDER — CYCLOBENZAPRINE HCL 5 MG PO TABS
7.5000 mg | ORAL_TABLET | Freq: Three times a day (TID) | ORAL | Status: DC | PRN
  Filled 2015-02-24: qty 1.5

## 2015-02-24 MED ORDER — MORPHINE SULFATE (PF) 4 MG/ML IV SOLN
4.0000 mg | Freq: Once | INTRAVENOUS | Status: AC
  Administered 2015-02-24: 4 mg via INTRAVENOUS
  Filled 2015-02-24: qty 1

## 2015-02-24 MED ORDER — ACETAMINOPHEN 325 MG PO TABS: 650.0000 mg | ORAL_TABLET | Freq: Four times a day (QID) | ORAL | Status: DC | PRN

## 2015-02-24 MED ORDER — POTASSIUM CHLORIDE CRYS ER 20 MEQ PO TBCR
20.0000 meq | EXTENDED_RELEASE_TABLET | ORAL | Status: DC
  Administered 2015-02-24 – 2015-02-26 (×2): 20 meq via ORAL
  Filled 2015-02-24 (×2): qty 1

## 2015-02-24 MED ORDER — PREDNISONE 20 MG PO TABS
20.0000 mg | ORAL_TABLET | Freq: Every day | ORAL | Status: DC
  Administered 2015-02-26: 20 mg via ORAL
  Filled 2015-02-24: qty 1

## 2015-02-24 MED ORDER — SODIUM CHLORIDE 0.9 % IV SOLN: Freq: Once | INTRAVENOUS | Status: DC

## 2015-02-24 MED ORDER — LORATADINE 10 MG PO TABS
10.0000 mg | ORAL_TABLET | Freq: Every day | ORAL | Status: DC | PRN
Start: 2015-02-24 — End: 2015-02-26

## 2015-02-24 MED ORDER — MORPHINE SULFATE (PF) 2 MG/ML IV SOLN
2.0000 mg | INTRAVENOUS | Status: DC | PRN
  Administered 2015-02-24: 2 mg via INTRAVENOUS
  Filled 2015-02-24: qty 1

## 2015-02-24 MED ORDER — DOCUSATE SODIUM 100 MG PO CAPS
100.0000 mg | ORAL_CAPSULE | Freq: Every day | ORAL | Status: DC
  Administered 2015-02-24 – 2015-02-26 (×2): 100 mg via ORAL
  Filled 2015-02-24 (×2): qty 1

## 2015-02-24 MED ORDER — HYDROCODONE-ACETAMINOPHEN 5-325 MG PO TABS
1.0000 | ORAL_TABLET | ORAL | Status: DC | PRN
  Administered 2015-02-25 – 2015-02-26 (×2): 1 via ORAL
  Filled 2015-02-24 (×2): qty 1

## 2015-02-24 MED ORDER — MORPHINE SULFATE (PF) 4 MG/ML IV SOLN
4.0000 mg | Freq: Once | INTRAVENOUS | Status: AC
  Administered 2015-02-24: 4 mg via INTRAVENOUS

## 2015-02-24 MED ORDER — ACETAMINOPHEN 650 MG RE SUPP: 650.0000 mg | Freq: Four times a day (QID) | RECTAL | Status: DC | PRN

## 2015-02-24 MED ORDER — SODIUM CHLORIDE 0.9% FLUSH: 3.0000 mL | Freq: Two times a day (BID) | INTRAVENOUS | Status: DC

## 2015-02-24 NOTE — Progress Notes (Signed)
Discussed with patients oncologist and she agreed for blood transfusion, 1 unit prbc prdered

## 2015-02-24 NOTE — ED Notes (Signed)
Patient transported to CT 

## 2015-02-24 NOTE — ED Notes (Signed)
Pt repositioned

## 2015-02-24 NOTE — ED Notes (Signed)
Patient transported to X-ray 

## 2015-02-24 NOTE — Consult Note (Addendum)
Patient is a 82 year old white male who comes in with severe low back pain. He does not recall recent fall that he trip recently. He had x-rays CT and MRI that shows severe compression fractures of L2 and L3 and I was consulted for evaluation for possible kyphoplasty. He is not currently on any any anticoagulation other than 81 mg aspirin daily. He currently is being treated at [facility] for lymphoma.  He reports severe pain with any attempts at standing or walking and he is unable to do so at present. When lying in bed he does not have too much pain. He does report some aching in the legs. This is recent onset. He denies numbness or tingling.  On exam he is able flex extend the toes and ankles without any apparent weakness knee is straight leg raising. He has point tenderness with kyphotic deformity at L2-3. A some tenderness in the paraspinous muscles.  Review of his prior studies shows a CT back in August it does not show evidence of a fracture states today show near vertebral plana at L2 with marked compression as well as L3 they both appear at least subacute. The L2 fracture was visible on the PET scan from 3 weeks ago but it was mild compression at that point so this should be also an acute fracture 3 was normal on prior scan for fracture  Impression is severe compression fractures L2 and L3 with severe symptoms and inability to ambulate. I think there is indication for kyphoplasty. Discussed risks benefits possible complications. Plan on procedure tomorrow

## 2015-02-24 NOTE — ED Notes (Signed)
Dr. Edd Fabian notified of critical trop of 0.05

## 2015-02-24 NOTE — ED Notes (Signed)
Pt arrives via EMS from home, pt was recently treated for a UTI and still complains of lower back pain and pain to both legs so bad he states he cant walk on them, pt uses walker at home and believes that's why his back hurts, awake and alert upon arrival, MD at bedside

## 2015-02-24 NOTE — ED Notes (Addendum)
Pt returned from xray, resting in bed, o2 sat 85-88% on RA, pt encouraged to take some deep breathes, 2l [place] placed on pt, wife at bedside

## 2015-02-24 NOTE — ED Provider Notes (Signed)
[facility] Emergency Department Provider Note  ____________________________________________  Time seen: Approximately 9:36 AM  I have reviewed the triage vital signs and the nursing notes.   HISTORY  Chief Complaint Leg Pain and Back Pain    HPI [name] is a 82 y.o. 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 The patient reports that he is having pain in his legs but they also feel weak and this is limiting his ambulation. He denies any urinary or bladder incontinence. He denies any fevers. He denies any chest pain or shortness of breath. He denies any recent falls. He denies any saddle anesthesia. He reports significant swelling associated with bilateral legs as well as his abdomen.   Past Medical History  Diagnosis Date  . Atrial fibrillation ([place]) 2007    s/p ablation  . Glaucoma   . Hypertension   . BPH (benign prostatic hypertrophy) 1989    s/p TURP  . History of chicken pox   . Microhematuria 1994    longstanding per pt, states normal w/u in past (has had cystoscopy, CT)  . Diastolic CHF ([place]) 0000000    grade 1  . Diverticulosis of colon (without mention of hemorrhage)   . Colon polyps 06/2012    multiple tubular adenomas, rec rpt 1 yr [facility] Iles)  . CLL (chronic lymphocytic leukemia) ([place])   . Chronic anemia   . Pruritus     Patient Active Problem List   Diagnosis Date Noted  . Back pain 02/24/2015  . Pulmonary hypertension ([place]) 09/10/2014  . Palpitations 08/29/2014  . Vitamin B12 deficiency 07/31/2012  . Skin rash 05/17/2012  . Anemia 05/01/2012  . Syncope 05/01/2012  . Microhematuria    . BPH (benign prostatic hypertrophy)   . COPD (chronic obstructive pulmonary disease) ([place]) 04/24/2012  . Right ankle pain 02/07/2012  . Right foot pain 02/07/2012  . Atrial fibrillation ([place])   . Glaucoma   . Hypertension   . History of colon polyps     Past Surgical History  Procedure Laterality Date  . Hernia repair  x6    inguinal, ventral  . Cataract extraction, bilateral    . Umbilical hernia repair      strangulated  . Radiofrequency ablation  2007    Afib  . Transurethral resection of prostate  1989    BPH  . Colonoscopy  06/2008    no polyps, rec rpt 4-5 yrs given hx polyps (done in [facility])  . US echocardiography  04/2012    EF 55-60%, normal systol fxn, grade 1 diastolic dysfunction, mildly dilated LA, mild pulm HTN  . Carotid US  04/2012    mild plaque bilaterally (0-39%)  . Colonoscopy  06/2012    adenomatous polyp, severe diverticulosis, rec rpt 1 yr [facility] Iles)  . Esophagogastroduodenoscopy  06/2012    duodenal divertic, o/w WNL [facility] Iles)  . Cardiac catheterization  2007    Current Outpatient Rx  Name  Route  Sig  Dispense  Refill  . aspirin 81 MG tablet   Oral   Take 81 mg by mouth daily.         . cyclobenzaprine (FLEXERIL) 5 MG tablet   Oral  Take 1 tablet by mouth 3 (three) times daily as needed.         . DiphenhydrAMINE HCl (BENADRYL ALLERGY PO)   Oral   Take by mouth at bedtime.          . docusate sodium (COLACE) 100 MG capsule   Oral   Take 100 mg by mouth daily.          . ferrous sulfate 325 (65 FE) MG tablet   Oral   Take 325 mg by mouth 3 (three) times daily with meals.          . furosemide (LASIX) 40 MG tablet   Oral   Take 40 mg by mouth.         . hydrOXYzine (ATARAX/VISTARIL) 25 MG tablet   Oral   Take 25 mg by mouth 3 (three) times daily.          Marland Kitchen lactulose (CHRONULAC) 10 GM/15ML solution   Oral   Take 10 g by mouth daily.          Marland Kitchen loratadine (CLARITIN) 10 MG tablet   Oral   Take 10 mg by  mouth daily as needed.          . potassium chloride SA (K-DUR,KLOR-CON) 20 MEQ tablet   Oral   Take 20 mEq by mouth daily.         . predniSONE (DELTASONE) 10 MG tablet   Oral   Take 10-20 mg by mouth daily with breakfast.          . psyllium (REGULOID) 0.52 G capsule   Oral   Take 0.52 g by mouth daily.         . traMADol (ULTRAM) 50 MG tablet   Oral   Take 1 tablet by mouth every 6 (six) hours as needed.           Allergies Rituximab; Penicillin g potassium in d5w; and Penicillins  Family History  Problem Relation Age of Onset  . Adopted: Yes  . Cancer Father     lung, smoker  . Stroke Mother   . Diabetes Brother   . CAD Neg Hx     Social History Social History  Substance Use Topics  . Smoking status: Former Smoker -- 1.00 packs/day for 40 years    Types: Cigarettes    Start date: 01/25/1948    Quit date: 01/24/1993  . Smokeless tobacco: Never Used  . Alcohol Use: No     Comment: Regular (2-3 drinks wine/day)    Review of Systems Constitutional: No fever/chills Eyes: No visual changes. ENT: No sore throat. Cardiovascular: Denies chest pain. Respiratory: Denies shortness of breath. Gastrointestinal: No abdominal pain.  No nausea, no vomiting.  No diarrhea.  No constipation. Genitourinary: Negative for dysuria. Musculoskeletal: Positive for back pain. Skin: Negative for rash. Neurological: Negative for headaches, focal weakness or numbness.  10-point ROS otherwise negative.  ____________________________________________   PHYSICAL EXAM:  VITAL SIGNS: ED Triage Vitals  Enc Vitals Group     BP 02/24/15 0901 121/74 mmHg     Pulse Rate 02/24/15 0901 111     Resp 02/24/15 0901 18     Temp 02/24/15 0901 98.1 F (36.7 C)     Temp Source 02/24/15 0901 Oral     SpO2 02/24/15 0858 93 %     Weight 02/24/15 0901 180 lb (81.647 kg)     Height 02/24/15 0901 6' (1.829 m)     Head Cir --  Peak Flow --      Pain Score 02/24/15 0902 10      Pain Loc --      Pain Edu? --      Excl. in [place]? --     Constitutional: Alert and oriented. The patient is pale appearing but in no acute distress. He does grimace in pain when he rotates at the torso or symptoms to move/reposition himself in bed. Eyes: Conjunctivae are normal. PERRL. EOMI. Head: Atraumatic. Nose: No congestion/rhinnorhea. Mouth/Throat: Mucous membranes are moist.  Oropharynx non-erythematous. Neck: No stridor.  Cardiovascular: Mildly tachycardic rate, regular rhythm. Grossly normal heart sounds.  Good peripheral circulation. Respiratory: Mild tachypnea. Normal respiratory effort.  Diminished breath sounds throughout bilateral lung fields. Gastrointestinal: Soft and nontender. +diffuse abdominal distension.  Genitourinary: deferred Musculoskeletal: 4+ pitting edema bilateral lower extremities. Tenderness to palpation throughout the midline and the paravertebral muscles bilaterally associated with the thoracic and lumbar spine. Neurologic:  Normal speech and language. 5 out of 5 strength with flexion of the big toes bilaterally. No saddle anesthesia. Intact sensation to light touch throughout. Skin:  Skin is warm, dry and intact. No rash noted. Marked skin pallor is noted. Psychiatric: Mood and affect are normal. Speech and behavior are normal.  ____________________________________________   LABS (all labs ordered are listed, but only abnormal results are displayed)  Labs Reviewed  CBC WITH DIFFERENTIAL/PLATELET - Abnormal; Notable for the following:    RBC 2.20 (*)    Hemoglobin 7.0 (*)    HCT 21.7 (*)    RDW 19.9 (*)    Neutro Abs 7.8 (*)    Lymphs Abs 0.4 (*)    Monocytes Absolute 1.3 (*)    All other components within normal limits  COMPREHENSIVE METABOLIC PANEL - Abnormal; Notable for the following:    Chloride 100 (*)    Glucose, Bld 101 (*)    BUN 23 (*)    Creatinine, Ser 0.60 (*)    Calcium 8.7 (*)    Total Protein 6.2 (*)    Albumin 2.5 (*)    AST  13 (*)    ALT 13 (*)    All other components within normal limits  URINALYSIS COMPLETEWITH MICROSCOPIC ([facility] ONLY) - Abnormal; Notable for the following:    Color, Urine YELLOW (*)    APPearance CLEAR (*)    Ketones, ur TRACE (*)    Leukocytes, UA 1+ (*)    Bacteria, UA RARE (*)    Squamous Epithelial / LPF 0-5 (*)    All other components within normal limits  BRAIN NATRIURETIC PEPTIDE - Abnormal; Notable for the following:    B Natriuretic Peptide 489.0 (*)    All other components within normal limits  TROPONIN I - Abnormal; Notable for the following:    Troponin I 0.05 (*)    All other components within normal limits  CULTURE, BLOOD (ROUTINE X 2)  CULTURE, BLOOD (ROUTINE X 2)  LACTIC ACID, PLASMA  TROPONIN I  TROPONIN I  TROPONIN I  TYPE AND SCREEN  ABO/RH  TYPE AND SCREEN  PREPARE RBC (CROSSMATCH)   ____________________________________________  EKG  ED ECG REPORT I, Joanne Gavel, the attending physician, personally viewed and interpreted this ECG.   Date: 02/24/2015  EKG Time: 09:15  Rate: 111  Rhythm: sinus tachycardia  Axis: normal  Intervals:nonspecific intraventricular conduction delay  ST&T Change: No acute ST elevation. LVH with repolarization abnormality.  ____________________________________________  RADIOLOGY  CXR IMPRESSION: Moderate in size right pleural vision and probably  small left pleural effusion.  Prominence of the interstitium likely represents pulmonary vascular congestion.  Bibasilar atelectasis.   Xray Thoracic spine and lumbar spine IMPRESSION: There is new significant compression fracture L2 and L3 vertebral bodies. These are suspicious for acute fractures. Further correlation with MRI or CT scan is recommended. Diffuse osteopenia. Degenerative changes with disc space flattening at L5-S1 level. Facet degenerative changes L4 and L5 level. Mild mid lumbar levoscoliosis. IMPRESSION: Degenerative changes throughout the  thoracic spine. No acute bony abnormality.  Small bilateral effusions. CT lumbar IMPRESSION: Severe compression fracture of L2 vertebral body with approximately 90% height loss and 4 mm retropulsion. Moderate L2-L3 bony neural foramena narrowing.  Moderate in severity compression fracture of L3 vertebral body with approximately 50% height loss and 3 mm retropulsion.  Multilevel osteoarthritic changes of the lumbosacral spine, with moderate bony neural foramina narrowing bilaterally at L4-L5 and L5-S1.  Moderate osteopenia.  MRI lumbar spine IMPRESSION: Subacute to acute biconcave compression fracture of L3.  L2 compression fracture is seen on the patient's prior PET CT scan but there has been marked progression of vertebral body height loss since that study consistent with acute on subacute to chronic fracture.  Mild bony retropulsion at the fracture levels as described above without notable central canal stenosis. There is some mild right lateral recess narrowing at L2-3 due to retropulsed bone.  Diffusely abnormal marrow signal and extensive retroperitoneal lymphadenopathy consistent with the patient's history of lymphoma. ____________________________________________   PROCEDURES  Procedure(s) performed: None  Critical Care performed: No  ____________________________________________   INITIAL IMPRESSION / ASSESSMENT AND PLAN / ED COURSE  Pertinent labs & imaging results that were available during my care of the patient were reviewed by me and considered in my medical decision making (see chart for details).  BRAYANT SOBH is a 82 y.o. 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 On exam, he appears chronically ill and fatigued, he has marked skin pallor, mildly tachycardic and tachypneic  though this may be secondary to pain, given his immunocompromise state, we'll send labs, blood cultures, lactic acid. He is afebrile. He is significant edema of bilateral lower extremity is as well as the abdomen. He does have an intact neurological examination. We will obtain plain films of the thoracic and lumbar spine and treat his pain. Urinalysis pending. Reassess for disposition.  ----------------------------------------- 12:20 PM on 02/24/2015 -----------------------------------------  Plain films were concerning for lumbar compression fractures so CT scan was obtained which shows significant compression fractures at L2 and L3 with retropulsion however I discussed the case with Dr. Jetta Lout, the reading radiologist, who cannot visualize the cord therefore MRI lumbar spine has been ordered. Anticipate admission.  ----------------------------------------- 3:08 PM on 02/24/2015 ----------------------------------------- MRI shows no cord compression/cord signal abnormality. Case discussed with hospitalist, Dr. Dede Query for admission at this time.   ____________________________________________   FINAL CLINICAL IMPRESSION(S) / ED DIAGNOSES  Final diagnoses:  Pain  Lumbar compression fracture, closed, initial encounter ([place])      Joanne Gavel, MD 02/24/15 1727

## 2015-02-24 NOTE — H&P (Signed)
[place] [place] NAME: [name]    MR#:  [id number]  DATE OF BIRTH:  01-26-1932  DATE OF ADMISSION:  02/24/2015  PRIMARY CARE PHYSICIAN: BABAOFF, Caryl Bis, MD   REQUESTING/REFERRING PHYSICIAN: Dr. Loura Pardon  CHIEF COMPLAINT:   Chief Complaint  Patient presents with  . Leg Pain  . Back Pain    HISTORY OF PRESENT ILLNESS:  [name]  is a 82 y.o. male with a known history of low-grade lymphoma and chronic anemia requiring transfusions, history of atrial fibrillation status post ablation, hypertension and diastolic CHF presents from home secondary to worsening low back pain going on for 2 weeks now. Patient follows with [facility] oncology for his low-grade lymphoma and has been on Revlimid up until one month ago. He also received blood transfusion in December and his hemoglobin usually stays around 8. He developed significant rash after his Revlimid cycle and also transfusion show Revlimid was stopped at the time. His most recent PET scan done about 3 weeks ago was stable according to patient's wife. Patient has been on prednisone for his rash for almost a month now. He's been taking 20 mg every day. He denies any trauma to his back. There was an incident where he got up from his chair and due to weakness fell back into the chair. Did not hear any snap. His back pain started about 2 weeks ago has been mostly in the lower back, saw his PCP and was started on Flexeril as needed. Relieved the pain a little bit, then he started developing left thigh and knee pain. He saw his PCP for the same and got a steroid injection in the left knee. But since yesterday his back pain has been worse with bilateral radiation to both the knees and also thighs. He couldn't even get up from bed and so presented to the emergency room. MRI of the lumbar spine here showed L2-L3 compression fractures, likely acute and minimal disc bulge and facet  arthropathy.  PAST MEDICAL HISTORY:   Past Medical History  Diagnosis Date  . Atrial fibrillation ([place]) 2007    s/p ablation  . Glaucoma   . Hypertension   . BPH (benign prostatic hypertrophy) 1989    s/p TURP  . History of chicken pox   . Microhematuria 1994    longstanding per pt, states normal w/u in past (has had cystoscopy, CT)  . Diastolic CHF ([place]) 0000000    grade 1  . Diverticulosis of colon (without mention of hemorrhage)   . Colon polyps 06/2012    multiple tubular adenomas, rec rpt 1 yr [facility])  . CLL (chronic lymphocytic leukemia) ([place])   . Chronic anemia   . Pruritus     PAST SURGICAL HISTORY:   Past Surgical History  Procedure Laterality Date  . Hernia repair  x6    inguinal, ventral  . Cataract extraction, bilateral    . Umbilical hernia repair      strangulated  . Radiofrequency ablation  2007    Afib  . Transurethral resection of prostate  1989    BPH  . Colonoscopy  06/2008    no polyps, rec rpt 4-5 yrs given hx polyps (done in [facility])  . US echocardiography  04/2012    EF 55-60%, normal systol fxn, grade 1 diastolic dysfunction, mildly dilated LA, mild pulm HTN  . Carotid US  04/2012    mild plaque bilaterally (0-39%)  . Colonoscopy  06/2012    adenomatous polyp, severe diverticulosis, rec rpt 1 yr [facility] Iles)  . Esophagogastroduodenoscopy  06/2012    duodenal divertic, o/w WNL [facility] Iles)  . Cardiac catheterization  2007    SOCIAL HISTORY:   Social History  Substance Use Topics  . Smoking status: Former Smoker -- 1.00 packs/day for 40 years    Types: Cigarettes    Start date: 01/25/1948    Quit date: 01/24/1993  . Smokeless tobacco: Never Used  . Alcohol Use: No     Comment: Regular (2-3 drinks wine/day)    FAMILY HISTORY:   Family History  Problem Relation Age of Onset  . Adopted: Yes  . Cancer Father     lung, smoker  . Stroke Mother   . Diabetes Brother   . CAD Neg Hx     DRUG ALLERGIES:   Allergies  Allergen  Reactions  . Rituximab Other (See Comments) and Shortness Of Breath    Passed out, sweating, shaky Passed out, sweating, shakey  . Penicillin G Potassium In D5w Rash  . Penicillins Rash    Tested negative, but reacted negatively    REVIEW OF SYSTEMS:   Review of Systems  Constitutional: Negative for fever, chills and weight loss.  HENT: Negative for ear discharge, ear pain, hearing loss and nosebleeds.   Eyes: Negative for blurred vision, double vision and photophobia.  Respiratory: Negative for cough, hemoptysis, shortness of breath and wheezing.   Cardiovascular: Positive for leg swelling. Negative for chest pain, palpitations and orthopnea.  Gastrointestinal: Negative for heartburn, nausea, vomiting, abdominal pain, diarrhea, constipation and melena.  Genitourinary: Negative for dysuria, urgency, frequency and hematuria.  Musculoskeletal: Positive for myalgias, back pain and joint pain. Negative for neck pain.  Skin: Negative for rash.  Neurological: Positive for weakness. Negative for dizziness, tremors, sensory change, speech change, focal weakness and headaches.  Endo/Heme/Allergies: Does not bruise/bleed easily.  Psychiatric/Behavioral: Negative for depression.    MEDICATIONS AT HOME:   Prior to Admission medications   Medication Sig Start Date End Date Taking? Authorizing Provider  aspirin 81 MG tablet Take 81 mg by mouth daily.   Yes Historical Provider, MD  cyclobenzaprine (FLEXERIL) 5 MG tablet Take 1 tablet by mouth 3 (three) times daily as needed. 01/29/15  Yes Historical Provider, MD  DiphenhydrAMINE HCl (BENADRYL ALLERGY PO) Take by mouth at bedtime.    Yes Historical Provider, MD  docusate sodium (COLACE) 100 MG capsule Take 100 mg by mouth daily.    Yes Historical Provider, MD  ferrous sulfate 325 (65 FE) MG tablet Take 325 mg by mouth 3 (three) times daily with meals.    Yes Historical Provider, MD  furosemide (LASIX) 40 MG tablet Take 40 mg by mouth.   Yes  Historical Provider, MD  hydrOXYzine (ATARAX/VISTARIL) 25 MG tablet Take 25 mg by mouth 3 (three) times daily.    Yes Historical Provider, MD  lactulose (CHRONULAC) 10 GM/15ML solution Take 10 g by mouth daily.  12/29/14  Yes Historical Provider, MD  loratadine (CLARITIN) 10 MG tablet Take 10 mg by mouth daily as needed.    Yes Historical Provider, MD  potassium chloride SA (K-DUR,KLOR-CON) 20 MEQ tablet Take 20 mEq by mouth daily.   Yes Historical Provider, MD  predniSONE (DELTASONE) 10 MG tablet Take 10-20 mg by mouth daily with breakfast.    Yes Historical Provider, MD  psyllium (REGULOID) 0.52 G capsule Take 0.52 g by mouth daily.   Yes Historical Provider, MD  traMADol (ULTRAM) 50 MG  tablet Take 1 tablet by mouth every 6 (six) hours as needed. 02/18/15 02/28/15 Yes Historical Provider, MD      VITAL SIGNS:  Blood pressure 110/58, pulse 108, temperature 98.1 F (36.7 C), temperature source Oral, resp. rate 16, height 6' (1.829 m), weight 81.647 kg (180 lb), SpO2 93 %.  PHYSICAL EXAMINATION:   Physical Exam  GENERAL:  82 y.o.-year-old patient lying in the bed with no acute distress.  EYES: Pupils equal, round, reactive to light and accommodation. No scleral icterus. Extraocular muscles intact.  HEENT: Head atraumatic, normocephalic. Oropharynx and nasopharynx clear.  NECK:  Supple, no jugular venous distention. No thyroid enlargement, no tenderness.  LUNGS: Normal breath sounds bilaterally, no wheezing, rales,rhonchi or crepitation. No use of accessory muscles of respiration. Decreased bibasilar breath sounds. CARDIOVASCULAR: S1, S2 normal. Rapid but regular rate. No  rubs, or gallops. 3/6 systolic murmur is present. Right chest Port-A-Cath is present. ABDOMEN: Soft, nontender, nondistended. Bowel sounds present. No organomegaly or mass.  EXTREMITIES: No  cyanosis, or clubbing. 3+ pitting pedal edema onto the knees NEUROLOGIC: Cranial nerves II through XII are intact. Muscle strength 5/5  in all extremities. Sensation intact. Gait not checked. Generalized weakness noted. PSYCHIATRIC: The patient is alert and oriented x 3.  SKIN: No obvious rash, lesion, or ulcer.   LABORATORY PANEL:   CBC  Recent Labs Lab 02/24/15 0918  WBC 9.6  HGB 7.0*  HCT 21.7*  PLT 205   ------------------------------------------------------------------------------------------------------------------  Chemistries   Recent Labs Lab 02/24/15 0918  NA 139  K 3.5  CL 100*  CO2 32  GLUCOSE 101*  BUN 23*  CREATININE 0.60*  CALCIUM 8.7*  AST 13*  ALT 13*  ALKPHOS 85  BILITOT 1.0   ------------------------------------------------------------------------------------------------------------------  Cardiac Enzymes  Recent Labs Lab 02/24/15 0918  TROPONINI 0.05*   ------------------------------------------------------------------------------------------------------------------  RADIOLOGY:  Dg Chest 2 View  02/24/2015  CLINICAL DATA:  Bilateral lower extremity edema and severe low back pain. EXAM: CHEST  2 VIEW COMPARISON:  12/11/2014 FINDINGS: Right internal jugular approach injectable port is stable. The cardiac silhouette is nonenlarged. Mediastinal contours appear intact. There is persistent moderate in size right pleural effusion with predominantly peribronchovascular lower lobe airspace opacities. There is probably smaller left pleural effusion. Interstitial prominence is seen bilaterally. Osseous structures are without acute abnormality. Soft tissues are grossly normal. IMPRESSION: Moderate in size right pleural vision and probably small left pleural effusion. Prominence of the interstitium likely represents pulmonary vascular congestion. Bibasilar atelectasis. Electronically Signed   By: Fidela Salisbury M.D.   On: 02/24/2015 10:27   Dg Thoracic Spine 2 View  02/24/2015  CLINICAL DATA:  Weakness.  Low back pain.  Severe upper back pain EXAM: THORACIC SPINE 2 VIEWS COMPARISON:   PET CT 01/27/2015 FINDINGS: Degenerative changes throughout the thoracic spine. Mild generalized rightward scoliosis. No fracture or subluxation. Small bilateral pleural effusions are noted. IMPRESSION: Degenerative changes throughout the thoracic spine. No acute bony abnormality. Small bilateral effusions. Electronically Signed   By: Rolm Baptise M.D.   On: 02/24/2015 10:20   Dg Lumbar Spine 2-3 Views  02/24/2015  CLINICAL DATA:  low back pain, bilateral leg pain EXAM: LUMBAR SPINE - 2-3 VIEW COMPARISON:  01/27/2015 FINDINGS: Three views of lumbar spine submitted. Diffuse osteopenia is noted. Mild mid lumbar levoscoliosis. There is new significant compression fracture L2 and L3 vertebral bodies. These are suspicious for acute fractures. Further correlation with MRI or CT scan is recommended. Moderate disc space flattening at L5-S1 level. Facet degenerative  changes noted L4 and L5 level. IMPRESSION: There is new significant compression fracture L2 and L3 vertebral bodies. These are suspicious for acute fractures. Further correlation with MRI or CT scan is recommended. Diffuse osteopenia. Degenerative changes with disc space flattening at L5-S1 level. Facet degenerative changes L4 and L5 level. Mild mid lumbar levoscoliosis. Electronically Signed   By: Lahoma Crocker M.D.   On: 02/24/2015 10:22   Ct Lumbar Spine Wo Contrast  02/24/2015  CLINICAL DATA:  Low back pain. History of CLL. Patient denies history of injury. EXAM: CT LUMBAR SPINE WITHOUT CONTRAST TECHNIQUE: Multidetector CT imaging of the lumbar spine was performed without intravenous contrast administration. Multiplanar CT image reconstructions were also generated. COMPARISON:  Radiograph of the lumbar sacral spine 02/24/2015 FINDINGS: There is moderate osteopenia. There is an acute compression fracture of L2 vertebral body with approximately 90% height loss centrally, and an associated longitudinal fracture through the right pedicle. Similarly there is a  compression fracture of L3 vertebral body with associated longitudinal fracture through the right pedicle with approximately 50% height loss. No associated lytic or sclerotic lesions are seen, to suspect pathologic fractures. There is a 4 mm retropulsion of the superior portion of L2 vertebral body and 3 mm retropulsion of the superior portion of L3 vertebral body. There is a moderate narrowing of the bilateral L2-L3 bony neural foramena. The remainder of the lumbosacral vertebral bodies are normal in height. Mild multilevel osteoarthritic changes are seen, most prominent at T12-L1, L4-L5 and L5-S1. There is multilevel posterior facet arthropathy in the lower lumbosacral spine, with moderate bilateral bony neural foraminal narrowing at L4-L5 , and L5-S1. IMPRESSION: Severe compression fracture of L2 vertebral body with approximately 90% height loss and 4 mm retropulsion. Moderate L2-L3 bony neural foramena narrowing. Moderate in severity compression fracture of L3 vertebral body with approximately 50% height loss and 3 mm retropulsion. Multilevel osteoarthritic changes of the lumbosacral spine, with moderate bony neural foramina narrowing bilaterally at L4-L5 and L5-S1. Moderate osteopenia. Electronically Signed   By: Fidela Salisbury M.D.   On: 02/24/2015 11:57   Mr Lumbar Spine Wo Contrast  02/24/2015  CLINICAL DATA:  History of small B cell lymphoma. Low back pain radiating into both legs. Lumbar spine fracture. No known history of injury. EXAM: MRI LUMBAR SPINE WITHOUT CONTRAST TECHNIQUE: Multiplanar, multisequence MR imaging of the lumbar spine was performed. No intravenous contrast was administered. COMPARISON:  CT lumbar spine this same day.  PET CT scan 01/27/2015. FINDINGS: As seen on the comparison CT scan, the patient has compression fractures of L2 and L3. The L3 compression fracture is new since the most recent comparison examination. Only minimally increased T2 signal is seen within the vertebral  body likely secondary to a combination of tumor infiltration and compressed trabeculae. The patient had a mild inferior endplate compression fracture of L2 on the comparison PET scan but there has been marked progression of vertebral body height loss since that exam with vertebral plana deformity seen centrally on today's study. There is marrow edema in the superior endplate of L3 most consistent with acute or chronic injury. Marrow signal is diffusely decreased consistent with the patient's history of lymphoma. Vertebral body alignment is maintained. The conus medullaris is normal in signal and position. Retroperitoneal lymphadenopathy is noted as seen on prior exams. T11-12: Negative. T12-L1:  Negative. L1-2: Mild bony retropulsion off the superior endplate of L2 is identified and slightly indents the ventral thecal sac. No disc bulge or protrusion. There is only very  mild central canal narrowing. The foramina are open. L2-3: There is some bony retropulsion off the inferior endplate of L2 and superior endplate of L3 more prominent to the right. The central canal is mildly narrowed overall and there is some narrowing in the right lateral recess. The foramina appear open. L3-4: Annular fissure and minimal disc bulge without central canal or foraminal stenosis. L4-5: Very shallow disc bulge and mild facet arthropathy. The central canal and foramina are open. L5-S1:  Negative.  Tarlov cyst on the left is noted. IMPRESSION: Subacute to acute biconcave compression fracture of L3. L2 compression fracture is seen on the patient's prior PET CT scan but there has been marked progression of vertebral body height loss since that study consistent with acute on subacute to chronic fracture. Mild bony retropulsion at the fracture levels as described above without notable central canal stenosis. There is some mild right lateral recess narrowing at L2-3 due to retropulsed bone. Diffusely abnormal marrow signal and extensive  retroperitoneal lymphadenopathy consistent with the patient's history of lymphoma. Electronically Signed   By: Inge Rise M.D.   On: 02/24/2015 14:39    EKG:   Orders placed or performed during the hospital encounter of 02/24/15  . EKG 12-Lead  . EKG 12-Lead  . ED EKG  . ED EKG    IMPRESSION AND PLAN:   [name]  is a 82 y.o. male with a known history of low-grade lymphoma and chronic anemia requiring transfusions, history of atrial fibrillation status post ablation, hypertension and diastolic CHF presents from home secondary to worsening low back pain going on for 2 weeks now.  #1 Low back pain- pain is mostly in the right paravertebral region at L2-L3, MRI showing L2-L3 compression fractures. - patient denies any trauma, however has been on prednisone. -Dr. Rudene Christians from orthopedics notified. -Since most pain is in the muscles, added muscle relaxant, heating pad, Lidoderm patch. -Also pain medications as needed. -Physical therapy consulted. Care management consult in place.  #2 acute on chronic anemia-baseline hemoglobin is around 8. Patient has transfusion-dependent anemia due to his lymphoma. -However gets significant itching and rash after blood transfusion in spite of being given irradiated blood. -His oncologist Dr. Corey Harold has been paged and awaiting call back. Patient agrees for transfusion only after speaking to his oncologist.  #3 low-grade lymphoma-following up with [facility] oncology. Most recent PET scan 3 weeks ago has been stable. Revlimid stopped in December 2016.  #4 atrial fibrillation-had ablation and is in sinus rhythm now. Continue to monitor at this time. Not on anticoagulation. History of anemia noted.  #5 DVT prophylaxis-with his leg swelling, immobility for the last couple of weeks and history of lymphoma-will start on subcutaneous heparin for now. Also watch closely due to his anemia. Dopplers of lower extremities have been ordered to rule out  DVT  #6 elevated troponin-likely demand ischemia from anemia and also tachycardia. -Recycle troponins. Monitor on off unit telemetry.   Physical Therapy consulted.   All the records are reviewed and case discussed with ED provider. Management plans discussed with the patient, family and they are in agreement.  CODE STATUS: Full code  TOTAL TIME TAKING CARE OF THIS PATIENT: 50 minutes.    Gladstone Lighter M.D on 02/24/2015 at 3:51 PM  Between 7am to 6pm - Pager - [phone]  After 6pm go to [email or web address] - password [email or web address] [place] [facility]  Office  [phone]  CC: Primary care physician; BABAOFF, Caryl Bis, MD

## 2015-02-24 NOTE — ED Notes (Signed)
Pt transported to ultrasound.

## 2015-02-25 ENCOUNTER — Inpatient Hospital Stay: Payer: Medicare Other | Admitting: Anesthesiology

## 2015-02-25 ENCOUNTER — Encounter: Payer: Self-pay | Admitting: *Deleted

## 2015-02-25 ENCOUNTER — Encounter
Admission: EM | Disposition: A | Discharge status: Skilled Nursing Facility | Payer: Self-pay | Source: Home / Self Care | Attending: Internal Medicine

## 2015-02-25 ENCOUNTER — Inpatient Hospital Stay: Payer: Medicare Other

## 2015-02-25 DIAGNOSIS — L8990 Pressure ulcer of unspecified site, unspecified stage: Secondary | ICD-10-CM | POA: Insufficient documentation

## 2015-02-25 HISTORY — PX: KYPHOPLASTY: SHX5884

## 2015-02-25 LAB — TYPE AND SCREEN
ABO/RH(D): O POS
Antibody Screen: NEGATIVE
UNIT DIVISION: 0

## 2015-02-25 LAB — TROPONIN I
Troponin I: 0.06 ng/mL — ABNORMAL HIGH (ref <0.031)
Troponin I: 0.05 ng/mL — ABNORMAL HIGH (ref <0.031)

## 2015-02-25 LAB — CBC
HCT: 27.6 % — ABNORMAL LOW (ref 40.0–52.0)
Hemoglobin: 8.9 g/dL — ABNORMAL LOW (ref 13.0–18.0)
MCH: 31.7 pg (ref 26.0–34.0)
MCHC: 32.3 g/dL (ref 32.0–36.0)
MCV: 98.4 fL (ref 80.0–100.0)
PLATELETS: 236 10*3/uL (ref 150–440)
RBC: 2.81 MIL/uL — ABNORMAL LOW (ref 4.40–5.90)
RDW: 22.9 % — AB (ref 11.5–14.5)
WBC: 9.7 10*3/uL (ref 3.8–10.6)

## 2015-02-25 LAB — BASIC METABOLIC PANEL
Anion gap: 3 — ABNORMAL LOW (ref 5–15)
BUN: 23 mg/dL — AB (ref 6–20)
CHLORIDE: 103 mmol/L (ref 101–111)
CO2: 34 mmol/L — AB (ref 22–32)
CREATININE: 0.6 mg/dL — AB (ref 0.61–1.24)
Calcium: 8.5 mg/dL — ABNORMAL LOW (ref 8.9–10.3)
GFR calc Af Amer: >60 mL/min (ref >60)
GFR calc non Af Amer: >60 mL/min (ref >60)
Glucose, Bld: 88 mg/dL (ref 65–99)
Potassium: 3.7 mmol/L (ref 3.5–5.1)
SODIUM: 140 mmol/L (ref 135–145)

## 2015-02-25 SURGERY — KYPHOPLASTY
Anesthesia: General | Wound class: Clean

## 2015-02-25 MED ORDER — LIDOCAINE HCL 1 % IJ SOLN
INTRAMUSCULAR | Status: DC | PRN
  Administered 2015-02-25: 10 mL

## 2015-02-25 MED ORDER — LIDOCAINE HCL 1 % IJ SOLN
INTRAMUSCULAR | Status: DC | PRN
  Administered 2015-02-25: 75 mL via INTRAMUSCULAR

## 2015-02-25 MED ORDER — IOHEXOL 180 MG/ML  SOLN
INTRAMUSCULAR | Status: AC
  Filled 2015-02-25: qty 40

## 2015-02-25 MED ORDER — FENTANYL CITRATE (PF) 100 MCG/2ML IJ SOLN: 25.0000 ug | INTRAMUSCULAR | Status: DC | PRN

## 2015-02-25 MED ORDER — LACTATED RINGERS IV SOLN
INTRAVENOUS | Status: DC
  Administered 2015-02-25 (×3): via INTRAVENOUS

## 2015-02-25 MED ORDER — IOHEXOL 180 MG/ML  SOLN
INTRAMUSCULAR | Status: DC | PRN
  Administered 2015-02-25: 20 mL

## 2015-02-25 MED ORDER — PROPOFOL 10 MG/ML IV BOLUS
INTRAVENOUS | Status: DC | PRN
  Administered 2015-02-25: 20 mg via INTRAVENOUS

## 2015-02-25 MED ORDER — ZOLPIDEM TARTRATE 5 MG PO TABS
5.0000 mg | ORAL_TABLET | Freq: Every evening | ORAL | Status: DC | PRN
  Administered 2015-02-25 (×2): 5 mg via ORAL
  Filled 2015-02-25 (×2): qty 1

## 2015-02-25 MED ORDER — MIDAZOLAM HCL 2 MG/2ML IJ SOLN
INTRAMUSCULAR | Status: DC | PRN
  Administered 2015-02-25: 2 mg via INTRAVENOUS

## 2015-02-25 MED ORDER — PROPOFOL 500 MG/50ML IV EMUL
INTRAVENOUS | Status: DC | PRN
  Administered 2015-02-25: 50 ug/kg/min via INTRAVENOUS

## 2015-02-25 MED ORDER — BUPIVACAINE-EPINEPHRINE (PF) 0.5% -1:200000 IJ SOLN
INTRAMUSCULAR | Status: AC
  Filled 2015-02-25: qty 30

## 2015-02-25 MED ORDER — ONDANSETRON HCL 4 MG/2ML IJ SOLN: 4.0000 mg | Freq: Once | INTRAMUSCULAR | Status: DC | PRN

## 2015-02-25 MED ORDER — ESMOLOL HCL 100 MG/10ML IV SOLN
INTRAVENOUS | Status: DC | PRN
  Administered 2015-02-25: 10 mg via INTRAVENOUS

## 2015-02-25 MED ORDER — LIDOCAINE HCL (PF) 1 % IJ SOLN
INTRAMUSCULAR | Status: AC
  Filled 2015-02-25: qty 60

## 2015-02-25 MED ORDER — FLUMAZENIL 0.5 MG/5ML IV SOLN
INTRAVENOUS | Status: AC
  Administered 2015-02-25: 0.2 mg via INTRAVENOUS
  Filled 2015-02-25: qty 5

## 2015-02-25 MED ORDER — FLUMAZENIL 0.5 MG/5ML IV SOLN
0.2000 mg | Freq: Once | INTRAVENOUS | Status: AC
  Administered 2015-02-25: 0.2 mg via INTRAVENOUS

## 2015-02-25 SURGICAL SUPPLY — 17 items
CEMENT BONE KYPHON CDS (Cement) ×4 IMPLANT
CEMENT KYPHON CX01A KIT/MIXER (Cement) ×4 IMPLANT
DEVICE BIOPSY BONE KYPH ×2 IMPLANT
DEVICE BIOPSY BONE KYPHX (INSTRUMENTS) ×2 IMPLANT
DRAPE C-ARM XRAY 36X54 (DRAPES) ×2 IMPLANT
DURAPREP 26ML APPLICATOR (WOUND CARE) ×2 IMPLANT
GLOVE SURG ORTHO 9.0 STRL STRW (GLOVE) ×2 IMPLANT
GOWN SPECIALTY ULTRA XL (MISCELLANEOUS) ×2 IMPLANT
GOWN STRL REUS W/ TWL LRG LVL3 (GOWN DISPOSABLE) ×1 IMPLANT
GOWN STRL REUS W/TWL LRG LVL3 (GOWN DISPOSABLE) ×1
KYPHON XPANDER ×4 IMPLANT
LIQUID BAND (GAUZE/BANDAGES/DRESSINGS) ×2 IMPLANT
PACK KYPHOPLASTY (MISCELLANEOUS) ×2 IMPLANT
STRAP SAFETY BODY (MISCELLANEOUS) ×2 IMPLANT
TRAY KYPHOPAK 15/2 EXPRESS (KITS) ×2 IMPLANT
TRAY KYPHOPAK 15/3 EXPRESS 1ST (MISCELLANEOUS) IMPLANT
TRAY KYPHOPAK 20/3 EXPRESS 1ST (MISCELLANEOUS) ×2 IMPLANT

## 2015-02-25 NOTE — Progress Notes (Signed)
K-pad not put on pt. D/t hospital out of k-pads at this time.

## 2015-02-25 NOTE — NC FL2 (Signed)
[place] LEVEL OF CARE SCREENING TOOL     IDENTIFICATION  Patient Name: [name] Birthdate: 05-17-1932 Sex: male Admission Date (Current Location): 02/24/2015  [place] and [place] Number:  Engineering geologist and Address:  [facility], [address], [place], [place] [address]      Provider Number: [id number]  Attending Physician Name and Address:  Demetrios Loll, MD  Relative Name and Phone Number:       Current Level of Care: Hospital Recommended Level of Care: [place] Prior Approval Number:    Date Approved/Denied:   PASRR Number:  ([id number])  Discharge Plan: SNF    Current Diagnoses: Patient Active Problem List   Diagnosis Date Noted  . Pressure ulcer 02/25/2015  . Back pain 02/24/2015  . Pulmonary hypertension ([place]) 09/10/2014  . Palpitations 08/29/2014  . Vitamin B12 deficiency 07/31/2012  . Skin rash 05/17/2012  . Anemia 05/01/2012  . Syncope 05/01/2012  . Microhematuria   . BPH (benign prostatic hypertrophy)   . COPD (chronic obstructive pulmonary disease) ([place]) 04/24/2012  . Right ankle pain 02/07/2012  . Right foot pain 02/07/2012  . Atrial fibrillation ([place])   . Glaucoma   . Hypertension   . History of colon polyps     Orientation RESPIRATION BLADDER Height & Weight     Self  O2 (3 Liters Oxygen ) Continent Weight: 180 lb (81.647 kg) Height:  6' (182.9 cm)  BEHAVIORAL SYMPTOMS/MOOD NEUROLOGICAL BOWEL NUTRITION STATUS   (none )  (none) Continent Diet (NPO for surgery )  AMBULATORY STATUS COMMUNICATION OF NEEDS Skin   Extensive Assist Verbally Surgical wounds, PU Stage and Appropriate Care (Pressure Ulcer Stage 1: Coccyx. )                       Personal Care Assistance Level of Assistance  Bathing, Feeding, Dressing Bathing Assistance: Limited assistance Feeding assistance: Independent Dressing Assistance: Limited assistance     Functional Limitations Info  Sight,  Hearing, Speech Sight Info: Adequate Hearing Info: Adequate Speech Info: Adequate    SPECIAL CARE FACTORS FREQUENCY  PT (By licensed PT), OT (By licensed OT)     PT Frequency:  (5) OT Frequency:  (5)            Contractures      Additional Factors Info  Code Status, Allergies Code Status Info:  (Full Code. ) Allergies Info:  (Rituximab, Penicillin G Potassium In D5w, Penicillins)           Current Medications (02/25/2015):  This is the current hospital active medication list Current Facility-Administered Medications  Medication Dose Route Frequency Provider Last Rate Last Dose  . 0.9 %  sodium chloride infusion   Intravenous Once Gladstone Lighter, MD      . Doug Sou Hold] acetaminophen (TYLENOL) tablet 650 mg  650 mg Oral Q6H PRN Gladstone Lighter, MD       Or  . Doug Sou Hold] acetaminophen (TYLENOL) suppository 650 mg  650 mg Rectal Q6H PRN Gladstone Lighter, MD      . Doug Sou Hold] cyclobenzaprine (FLEXERIL) tablet 7.5 mg  7.5 mg Oral TID PRN Gladstone Lighter, MD      . Doug Sou Hold] docusate sodium (COLACE) capsule 100 mg  100 mg Oral Daily Gladstone Lighter, MD   100 mg at 02/24/15 1834  . fentaNYL (SUBLIMAZE) injection 25-50 mcg  25-50 mcg Intravenous Q5 min PRN Martha Clan, MD      . Doug Sou Hold]  ferrous sulfate tablet 325 mg  325 mg Oral TID WC Gladstone Lighter, MD   325 mg at 02/25/15 0933  . [MAR Hold] furosemide (LASIX) tablet 40 mg  40 mg Oral QODAY Gladstone Lighter, MD   40 mg at 02/25/15 0933  . [MAR Hold] heparin injection 5,000 Units  5,000 Units Subcutaneous 3 times per day Gladstone Lighter, MD   5,000 Units at 02/24/15 2237  . [MAR Hold] HYDROcodone-acetaminophen (NORCO/VICODIN) 5-325 MG per tablet 1-2 tablet  1-2 tablet Oral Q4H PRN Gladstone Lighter, MD      . Doug Sou Hold] hydrOXYzine (ATARAX/VISTARIL) tablet 25 mg  25 mg Oral TID Gladstone Lighter, MD   25 mg at 02/24/15 2238  . lactated ringers infusion   Intravenous Continuous Martha Clan, MD 50 mL/hr at 02/25/15  1424    . [MAR Hold] lactulose (CHRONULAC) 10 GM/15ML solution 10 g  10 g Oral Daily Gladstone Lighter, MD   10 g at 02/24/15 1834  . [MAR Hold] lidocaine (LIDODERM) 5 % 1 patch  1 patch Transdermal Q24H Gladstone Lighter, MD   1 patch at 02/24/15 2235  . [MAR Hold] loratadine (CLARITIN) tablet 10 mg  10 mg Oral Daily PRN Gladstone Lighter, MD      . Doug Sou Hold] morphine 2 MG/ML injection 2-3 mg  2-3 mg Intravenous Q4H PRN Gladstone Lighter, MD   2 mg at 02/24/15 1840  . [MAR Hold] ondansetron (ZOFRAN) tablet 4 mg  4 mg Oral Q6H PRN Gladstone Lighter, MD       Or  . Doug Sou Hold] ondansetron (ZOFRAN) injection 4 mg  4 mg Intravenous Q6H PRN Gladstone Lighter, MD      . ondansetron (ZOFRAN) injection 4 mg  4 mg Intravenous Once PRN Martha Clan, MD      . Doug Sou Hold] polyethylene glycol (MIRALAX / GLYCOLAX) packet 17 g  17 g Oral Daily PRN Gladstone Lighter, MD      . Doug Sou Hold] potassium chloride SA (K-DUR,KLOR-CON) CR tablet 20 mEq  20 mEq Oral QODAY Gladstone Lighter, MD   20 mEq at 02/24/15 1834  . [MAR Hold] predniSONE (DELTASONE) tablet 20 mg  20 mg Oral Q breakfast Gladstone Lighter, MD   20 mg at 02/25/15 0935  . [MAR Hold] sodium chloride flush (NS) 0.9 % injection 3 mL  3 mL Intravenous Q12H Gladstone Lighter, MD      . Doug Sou Hold] zolpidem (AMBIEN) tablet 5 mg  5 mg Oral QHS PRN Lance Coon, MD   5 mg at 02/25/15 0107   Facility-Administered Medications Ordered in Other Encounters  Medication Dose Route Frequency Provider Last Rate Last Dose  . sodium chloride 0.9 % injection 10 mL  10 mL Intravenous PRN Leia Alf, MD   10 mL at 08/20/14 1120     Discharge Medications: Please see discharge summary for a list of discharge medications.  Relevant Imaging Results:  Relevant Lab Results:   Additional Information  (SSN: [id number])  Loralyn Freshwater, LCSW

## 2015-02-25 NOTE — Op Note (Signed)
02/24/2015 - 02/25/2015  1:17 PM  PATIENT:  [name]  82 y.o. male  PRE-OPERATIVE DIAGNOSIS:  compression fracture L2 and L3 severe   POST-OPERATIVE DIAGNOSIS:  compression fracture lumbar 2, lumbar 3  PROCEDURE:  Procedure(s): KYPHOPLASTY L2,L3 (N/A)  SURGEON: Laurene Footman, MD  ASSISTANTS: None  ANESTHESIA:   local and [name]  EBL:  Total I/O In: -  Out: 200 [Urine:200]  BLOOD ADMINISTERED:none  DRAINS: none   LOCAL MEDICATIONS USED:  BUPIVICAINE  and XYLOCAINE   SPECIMEN:  Source of Specimen:  L2 and L3 vertebral body  DISPOSITION OF SPECIMEN:  PATHOLOGY  COUNTS:  YES  TOURNIQUET:  * No tourniquets in log *  IMPLANTS: Bone cement  DICTATION: .Dragon Dictation patient was brought the operating room and when adequate sedation was obtained the patient was placed prone with the belly hanging free. This is to try to get a postural reduction in part of the severe compression fractures C-arm was brought in and good visualization was obtained. After patient identification and timeout procedure a total of 10 cc was infiltrated in the areas of the planned incisions subcutaneously. After prepping and draping the sterile fashion repeat timeout procedure was carried out. Spinal needle was used to get down to the pedicle on the left and right sides at L2 and L3 injecting a mixture of half percent Sensorcaine and Xylocaine. The left side at L2 was addressed first with a small incision made beginning into the trocar with the express set since it was such vertebral plana the dura biopsy was obtained drilling carried out and a balloon inflated this gave partial correction of the vertebral plana such that the right side could be approached and the trocar placed into the vertebral body ballooning and eventually getting up to a total 6 cc getting a significant correction of the vertebral plana. Going to L3 the standard trocar was used and a biopsy obtained although the bone was quite soft and  very little bone specimen was obtained the balloons were inflated to a total of 10 cc with very good correction of the deformity at this level as well cement was mixed the L3 level filled first with approximately 10 cc of bone cement without extravasation. Going to L2 the left side was filled first and then going on the right side with a total of 6 cc infiltrated this gave very good fill of the vertebral body and again good correction of the vertebral plana. Trochars were all removed and permanent C-arm views obtained. Wounds were closed with Dermabond and then covered with a Band-Aid.  PLAN OF CARE: Continue as inpatient  PATIENT DISPOSITION:  PACU - hemodynamically stable.

## 2015-02-25 NOTE — Progress Notes (Signed)
Pt refuses administration of teds/scds

## 2015-02-25 NOTE — Progress Notes (Addendum)
PT Cancellation Note  Patient Details Name: [name] MRN: [id number] DOB: 1932/03/16   Cancelled Treatment:    Reason Eval/Treat Not Completed: Other (comment). Pt with pending kyphoplasty Sx this date. Will need new orders post-op including WB status in order to initiate therapy. Will dc orders at time.   Adalberto Metzgar 02/25/2015, 8:30 AM  Greggory Stallion, PT, DPT [phone]

## 2015-02-25 NOTE — Progress Notes (Signed)
preop report given to Phillips Grout, RN by inpt nurse Hassan Rowan via tele

## 2015-02-25 NOTE — Anesthesia Preprocedure Evaluation (Signed)
Anesthesia Evaluation  Patient identified by MRN, date of birth, ID band Patient awake    Reviewed: Allergy & Precautions, H&P , NPO status , Patient's Chart, lab work & pertinent test results, reviewed documented beta blocker date and time   History of Anesthesia Complications Negative for: history of anesthetic complications  Airway Mallampati: III  TM Distance: >3 FB Neck ROM: full    Dental no notable dental hx. (+) Poor Dentition, Caps, Missing, Chipped Permanent bridge on the top right:   Pulmonary neg shortness of breath, neg sleep apnea, COPD, Recent URI , Resolved, former smoker,    Pulmonary exam normal breath sounds clear to auscultation       Cardiovascular Exercise Tolerance: Good hypertension, (-) angina+CHF (diastolic)  (-) CAD, (-) Past MI, (-) Cardiac Stents and (-) CABG Normal cardiovascular exam+ dysrhythmias (s/p ablation in 2007) Atrial Fibrillation (-) Valvular Problems/Murmurs Rhythm:regular Rate:Normal     Neuro/Psych negative neurological ROS  negative psych ROS   GI/Hepatic negative GI ROS, Neg liver ROS,   Endo/Other  negative endocrine ROS  Renal/GU negative Renal ROS  negative genitourinary   Musculoskeletal   Abdominal   Peds  Hematology  (+) Blood dyscrasia, anemia , CLL   Anesthesia Other Findings Past Medical History:   Atrial fibrillation ([place])                       2007           Comment:s/p ablation   Glaucoma                                                     Hypertension                                                 BPH (benign prostatic hypertrophy)              1989           Comment:s/p TURP   History of chicken pox                                       Microhematuria                                  1994           Comment:longstanding per pt, states normal w/u in past               (has had cystoscopy, CT)   Diastolic CHF ([place])                             04/2012         Comment:grade 1   Diverticulosis of colon (without mention of he*              Colon polyps  06/2012         Comment:multiple tubular adenomas, rec rpt 1 yr               Sharlett Iles)   CLL (chronic lymphocytic leukemia) ([facility])                     Chronic anemia                                               Pruritus                                                     Reproductive/Obstetrics negative OB ROS                             Anesthesia Physical Anesthesia Plan  ASA: III  Anesthesia Plan: General   Post-op Pain Management:    Induction:   Airway Management Planned:   Additional Equipment:   Intra-op Plan:   Post-operative Plan:   Informed Consent: I have reviewed the patients History and Physical, chart, labs and discussed the procedure including the risks, benefits and alternatives for the proposed anesthesia with the patient or authorized representative who has indicated his/her understanding and acceptance.   Dental Advisory Given  Plan Discussed with: Anesthesiologist, CRNA and Surgeon  Anesthesia Plan Comments:         Anesthesia Quick Evaluation

## 2015-02-25 NOTE — Anesthesia Procedure Notes (Signed)
Date/Time: 02/25/2015 11:57 AM Performed by: Nelda Marseille Pre-anesthesia Checklist: Patient identified, Emergency Drugs available, Suction available, Patient being monitored and Timeout performed Oxygen Delivery Method: Nasal cannula Comments: [place] with Blow by 12LPM

## 2015-02-25 NOTE — Progress Notes (Signed)
[facility] NAME: [name]    MR#:  [id number]  DATE OF BIRTH:  September 28, 1932  SUBJECTIVE:  CHIEF COMPLAINT:   Chief Complaint  Patient presents with  . Leg Pain  . Back Pain   S/p kyphoplasty, in PICU, still cannot be waken up.  REVIEW OF SYSTEMS:  Unable to obtain.   DRUG ALLERGIES:   Allergies  Allergen Reactions  . Rituximab Other (See Comments) and Shortness Of Breath    Passed out, sweating, shaky Passed out, sweating, shakey  . Penicillin G Potassium In D5w Rash  . Penicillins Rash    Tested negative, but reacted negatively    VITALS:  Blood pressure 112/56, pulse 99, temperature 98.2 F (36.8 C), temperature source Tympanic, resp. rate 13, height 6' (1.829 m), weight 81.647 kg (180 lb), SpO2 94 %.  PHYSICAL EXAMINATION:  GENERAL:  82 y.o.-year-old patient lying in the bed with no acute distress.  EYES: closed eyes, unable to open. HEENT: Head atraumatic, normocephalic.  NECK:  Supple, no jugular venous distention. No thyroid enlargement, no tenderness.  LUNGS: Normal breath sounds bilaterally, no wheezing, rales,rhonchi or crepitation. No use of accessory muscles of respiration.  CARDIOVASCULAR: S1, S2 normal. No murmurs, rubs, or gallops.  ABDOMEN: Soft, nontender, nondistended. Bowel sounds present. No organomegaly or mass.  EXTREMITIES: No pedal edema, cyanosis, or clubbing.  NEUROLOGIC: unable to exam.  PSYCHIATRIC: The patient is sedated. SKIN: No obvious rash, lesion, or ulcer.    LABORATORY PANEL:   CBC  Recent Labs Lab 02/25/15 0554  WBC 9.7  HGB 8.9*  HCT 27.6*  PLT 236   ------------------------------------------------------------------------------------------------------------------  Chemistries   Recent Labs Lab 02/24/15 0918 02/25/15 0554  NA 139 140  K 3.5 3.7  CL 100* 103  CO2 32 34*  GLUCOSE 101* 88  BUN 23* 23*  CREATININE 0.60* 0.60*  CALCIUM 8.7* 8.5*   AST 13*  --   ALT 13*  --   ALKPHOS 85  --   BILITOT 1.0  --    ------------------------------------------------------------------------------------------------------------------  Cardiac Enzymes  Recent Labs Lab 02/25/15 0554  TROPONINI 0.05*   ------------------------------------------------------------------------------------------------------------------  RADIOLOGY:  Dg Chest 2 View  02/24/2015  CLINICAL DATA:  Bilateral lower extremity edema and severe low back pain. EXAM: CHEST  2 VIEW COMPARISON:  12/11/2014 FINDINGS: Right internal jugular approach injectable port is stable. The cardiac silhouette is nonenlarged. Mediastinal contours appear intact. There is persistent moderate in size right pleural effusion with predominantly peribronchovascular lower lobe airspace opacities. There is probably smaller left pleural effusion. Interstitial prominence is seen bilaterally. Osseous structures are without acute abnormality. Soft tissues are grossly normal. IMPRESSION: Moderate in size right pleural vision and probably small left pleural effusion. Prominence of the interstitium likely represents pulmonary vascular congestion. Bibasilar atelectasis. Electronically Signed   By: Fidela Salisbury M.D.   On: 02/24/2015 10:27   Dg Thoracic Spine 2 View  02/24/2015  CLINICAL DATA:  Weakness.  Low back pain.  Severe upper back pain EXAM: THORACIC SPINE 2 VIEWS COMPARISON:  PET CT 01/27/2015 FINDINGS: Degenerative changes throughout the thoracic spine. Mild generalized rightward scoliosis. No fracture or subluxation. Small bilateral pleural effusions are noted. IMPRESSION: Degenerative changes throughout the thoracic spine. No acute bony abnormality. Small bilateral effusions. Electronically Signed   By: Rolm Baptise M.D.   On: 02/24/2015 10:20   Dg Lumbar Spine 2-3 Views  02/25/2015  CLINICAL DATA:  Kyphoplasty. EXAM: LUMBAR SPINE - 2-3 VIEW;  DG C-ARM 61-120 MIN COMPARISON:  Lumbar spine films  from 02/24/2015. FINDINGS: Two intraoperative spot fluoro films are submitted. Patient is status consecutive level vertebral augmentation of 2 vertebral bodies. There has not been enough bony anatomy included on the images to identify levels of treatment, but based on the previous exam, this likely represents L2 and L3. IMPRESSION: Intraoperative evaluation during vertebral augmentation. Electronically Signed   By: Misty Stanley M.D.   On: 02/25/2015 13:41   Dg Lumbar Spine 2-3 Views  02/24/2015  CLINICAL DATA:  low back pain, bilateral leg pain EXAM: LUMBAR SPINE - 2-3 VIEW COMPARISON:  01/27/2015 FINDINGS: Three views of lumbar spine submitted. Diffuse osteopenia is noted. Mild mid lumbar levoscoliosis. There is new significant compression fracture L2 and L3 vertebral bodies. These are suspicious for acute fractures. Further correlation with MRI or CT scan is recommended. Moderate disc space flattening at L5-S1 level. Facet degenerative changes noted L4 and L5 level. IMPRESSION: There is new significant compression fracture L2 and L3 vertebral bodies. These are suspicious for acute fractures. Further correlation with MRI or CT scan is recommended. Diffuse osteopenia. Degenerative changes with disc space flattening at L5-S1 level. Facet degenerative changes L4 and L5 level. Mild mid lumbar levoscoliosis. Electronically Signed   By: Lahoma Crocker M.D.   On: 02/24/2015 10:22   Ct Lumbar Spine Wo Contrast  02/24/2015  CLINICAL DATA:  Low back pain. History of CLL. Patient denies history of injury. EXAM: CT LUMBAR SPINE WITHOUT CONTRAST TECHNIQUE: Multidetector CT imaging of the lumbar spine was performed without intravenous contrast administration. Multiplanar CT image reconstructions were also generated. COMPARISON:  Radiograph of the lumbar sacral spine 02/24/2015 FINDINGS: There is moderate osteopenia. There is an acute compression fracture of L2 vertebral body with approximately 90% height loss centrally, and  an associated longitudinal fracture through the right pedicle. Similarly there is a compression fracture of L3 vertebral body with associated longitudinal fracture through the right pedicle with approximately 50% height loss. No associated lytic or sclerotic lesions are seen, to suspect pathologic fractures. There is a 4 mm retropulsion of the superior portion of L2 vertebral body and 3 mm retropulsion of the superior portion of L3 vertebral body. There is a moderate narrowing of the bilateral L2-L3 bony neural foramena. The remainder of the lumbosacral vertebral bodies are normal in height. Mild multilevel osteoarthritic changes are seen, most prominent at T12-L1, L4-L5 and L5-S1. There is multilevel posterior facet arthropathy in the lower lumbosacral spine, with moderate bilateral bony neural foraminal narrowing at L4-L5 , and L5-S1. IMPRESSION: Severe compression fracture of L2 vertebral body with approximately 90% height loss and 4 mm retropulsion. Moderate L2-L3 bony neural foramena narrowing. Moderate in severity compression fracture of L3 vertebral body with approximately 50% height loss and 3 mm retropulsion. Multilevel osteoarthritic changes of the lumbosacral spine, with moderate bony neural foramina narrowing bilaterally at L4-L5 and L5-S1. Moderate osteopenia. Electronically Signed   By: Fidela Salisbury M.D.   On: 02/24/2015 11:57   Mr Lumbar Spine Wo Contrast  02/24/2015  CLINICAL DATA:  History of small B cell lymphoma. Low back pain radiating into both legs. Lumbar spine fracture. No known history of injury. EXAM: MRI LUMBAR SPINE WITHOUT CONTRAST TECHNIQUE: Multiplanar, multisequence MR imaging of the lumbar spine was performed. No intravenous contrast was administered. COMPARISON:  CT lumbar spine this same day.  PET CT scan 01/27/2015. FINDINGS: As seen on the comparison CT scan, the patient has compression fractures of L2 and L3. The L3  compression fracture is new since the most recent  comparison examination. Only minimally increased T2 signal is seen within the vertebral body likely secondary to a combination of tumor infiltration and compressed trabeculae. The patient had a mild inferior endplate compression fracture of L2 on the comparison PET scan but there has been marked progression of vertebral body height loss since that exam with vertebral plana deformity seen centrally on today's study. There is marrow edema in the superior endplate of L3 most consistent with acute or chronic injury. Marrow signal is diffusely decreased consistent with the patient's history of lymphoma. Vertebral body alignment is maintained. The conus medullaris is normal in signal and position. Retroperitoneal lymphadenopathy is noted as seen on prior exams. T11-12: Negative. T12-L1:  Negative. L1-2: Mild bony retropulsion off the superior endplate of L2 is identified and slightly indents the ventral thecal sac. No disc bulge or protrusion. There is only very mild central canal narrowing. The foramina are open. L2-3: There is some bony retropulsion off the inferior endplate of L2 and superior endplate of L3 more prominent to the right. The central canal is mildly narrowed overall and there is some narrowing in the right lateral recess. The foramina appear open. L3-4: Annular fissure and minimal disc bulge without central canal or foraminal stenosis. L4-5: Very shallow disc bulge and mild facet arthropathy. The central canal and foramina are open. L5-S1:  Negative.  Tarlov cyst on the left is noted. IMPRESSION: Subacute to acute biconcave compression fracture of L3. L2 compression fracture is seen on the patient's prior PET CT scan but there has been marked progression of vertebral body height loss since that study consistent with acute on subacute to chronic fracture. Mild bony retropulsion at the fracture levels as described above without notable central canal stenosis. There is some mild right lateral recess  narrowing at L2-3 due to retropulsed bone. Diffusely abnormal marrow signal and extensive retroperitoneal lymphadenopathy consistent with the patient's history of lymphoma. Electronically Signed   By: Inge Rise M.D.   On: 02/24/2015 14:39   US Venous Img Lower Bilateral  02/24/2015  CLINICAL DATA:  Lower extremity swelling for 1.5 weeks. Pain and edema. EXAM: BILATERAL LOWER EXTREMITY VENOUS DOPPLER ULTRASOUND TECHNIQUE: Gray-scale sonography with graded compression, as well as color Doppler and duplex ultrasound were performed to evaluate the lower extremity deep venous systems from the level of the common femoral vein and including the common femoral, femoral, profunda femoral, popliteal and calf veins including the posterior tibial, peroneal and gastrocnemius veins when visible. The superficial great saphenous vein was also interrogated. Spectral Doppler was utilized to evaluate flow at rest and with distal augmentation maneuvers in the common femoral, femoral and popliteal veins. COMPARISON:  05/31/2013 FINDINGS: RIGHT LOWER EXTREMITY Common Femoral Vein: No evidence of thrombus. Normal compressibility, respiratory phasicity and response to augmentation. Saphenofemoral Junction: No evidence of thrombus. Normal compressibility and flow on color Doppler imaging. Profunda Femoral Vein: No evidence of thrombus. Normal compressibility and flow on color Doppler imaging. Femoral Vein: No evidence of thrombus. Normal compressibility, respiratory phasicity and response to augmentation. Popliteal Vein: No evidence of thrombus. Normal compressibility, respiratory phasicity and response to augmentation. Calf Veins: No evidence of thrombus. Normal compressibility and flow on color Doppler imaging. Other Findings:  Prominent lymph node in the right groin. LEFT LOWER EXTREMITY Common Femoral Vein: No evidence of thrombus. Normal compressibility, respiratory phasicity and response to augmentation. Saphenofemoral  Junction: No evidence of thrombus. Normal compressibility and flow on color Doppler imaging. Profunda  Femoral Vein: No evidence of thrombus. Normal compressibility and flow on color Doppler imaging. Femoral Vein: No evidence of thrombus. Normal compressibility, respiratory phasicity and response to augmentation. Popliteal Vein: No evidence of thrombus. Normal compressibility, respiratory phasicity and response to augmentation. Calf Veins: No evidence of thrombus. Normal compressibility and flow on color Doppler imaging. Other Findings:  Multiple prominent lymph nodes in the left groin. IMPRESSION: No evidence of deep venous thrombosis in the lower extremities. Prominent lymph nodes in both groins. Findings are nonspecific and could be reactive. Electronically Signed   By: Markus Daft M.D.   On: 02/24/2015 16:58   Dg C-arm 1-60 Min  02/25/2015  CLINICAL DATA:  Kyphoplasty. EXAM: LUMBAR SPINE - 2-3 VIEW; DG C-ARM 61-120 MIN COMPARISON:  Lumbar spine films from 02/24/2015. FINDINGS: Two intraoperative spot fluoro films are submitted. Patient is status consecutive level vertebral augmentation of 2 vertebral bodies. There has not been enough bony anatomy included on the images to identify levels of treatment, but based on the previous exam, this likely represents L2 and L3. IMPRESSION: Intraoperative evaluation during vertebral augmentation. Electronically Signed   By: Misty Stanley M.D.   On: 02/25/2015 13:41    EKG:   Orders placed or performed during the hospital encounter of 02/24/15  . EKG 12-Lead  . EKG 12-Lead  . ED EKG  . ED EKG  . EKG 12-Lead  . EKG 12-Lead    ASSESSMENT AND PLAN:   #1 Low back pain due to compression fracture lumbar 2, lumbar 3. - patient denied any trauma, however has been on prednisone. S/p Kyphoplasty by Dr. Rudene Christians from orthopedics notified.  pain medications as needed. F/u Physical therapy.  #2 acute on chronic anemia-baseline hemoglobin was around 8. 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 -However got significant itching and rash after blood transfusion in spite of being given irradiated blood. Hb was 7.0 yesterday. S/p PRBC transfusion. Hb 8.9 today.  #3 low-grade lymphoma-following up with [facility]. Most recent PET scan 3 weeks ago has been stable. Revlimid stopped in December 2016.  #4 atrial fibrillation-had ablation and is in sinus rhythm now. Continue to monitor at this time. Not on anticoagulation. History of anemia noted.  #5 DVT prophylaxis. on subcutaneous heparin for now. Also watch closely due to his anemia. Dopplers of lower extremities: no DVT.  #6 elevated troponin-likely demand ischemia from anemia and also tachycardia. On telemetry.   All the records are reviewed and case discussed with Care Management/Social Workerr. Management plans discussed with the patient, family and they are in agreement.  CODE STATUS: full code.  TOTAL TIME TAKING CARE OF THIS PATIENT: 37 minutes.  Greater than 50% time was spent on coordination of care and face-to-face counseling.  POSSIBLE D/C IN 2-3 DAYS, DEPENDING ON CLINICAL CONDITION.   Demetrios Loll M.D on 02/25/2015 at 4:06 PM  Between 7am to 6pm - Pager - [phone]  After 6pm go to [email or web address] - password [email or web address] [facility]  Office  [phone]  CC: Primary care physician; BABAOFF, Caryl Bis, MD

## 2015-02-25 NOTE — Progress Notes (Signed)
Clindamycin on chart for OR

## 2015-02-25 NOTE — Transfer of Care (Signed)
Immediate Anesthesia Transfer of Care Note  Patient: [name]  Procedure(s) Performed: Procedure(s): KYPHOPLASTY L2,L3 (N/A)  Patient Location: PACU  Anesthesia Type:General  Level of Consciousness: sedated  Airway & Oxygen Therapy: Patient Spontanous Breathing and Patient connected to face mask oxygen  Post-op Assessment: Report given to RN and Post -op Vital signs reviewed and stable  Post vital signs: Reviewed and stable  Last Vitals:  Filed Vitals:   02/25/15 0827 02/25/15 1024  BP: 125/58 124/67  Pulse: 113 112  Temp: 36.6 C 37.4 C  Resp: 18 18    Complications: No apparent anesthesia complications

## 2015-02-25 NOTE — Care Management Note (Signed)
Case Management Note  Patient Details  Name: DUSTON SMOLENSKI MRN: [id number] Date of Birth: 1932/04/01  Subjective/Objective:                  Met with patient to discuss discharge planning. Patient is from home with his wife. He is open to [place] currently- seen "3 times" per patient. He is also followed at the cancer center. He has required rolling walker to ambulate recently due to back pain. Patient is pending kyphoplasty. PCP is Dr. Jimmie Molly. O2 is new.  Action/Plan: My contact card was left with patient. Corene Cornea with [facility] notified of patient admission. RNCM will continue to follow.   Expected Discharge Date:  02/26/15               Expected Discharge Plan:     In-House Referral:     Discharge planning Services  CM Consult  Post Acute Care Choice:  Home Health Choice offered to:  Patient  DME Arranged:    DME Agency:     HH Arranged:  PT, RN [place] Agency:  [facility]  Status of Service:  In process, will continue to follow  Medicare Important Message Given:    Date Medicare IM Given:    Medicare IM give by:    Date Additional Medicare IM Given:    Additional Medicare Important Message give by:     If discussed at [place] of Stay Meetings, dates discussed:    Additional Comments:  Marshell Garfinkel, RN 02/25/2015, 9:30 AM

## 2015-02-26 ENCOUNTER — Encounter: Payer: Self-pay | Admitting: Orthopedic Surgery

## 2015-02-26 ENCOUNTER — Encounter
Admission: RE | Admit: 2015-02-26 | Discharge: 2015-02-26 | Disposition: A | Discharge status: Home / Self Care | Payer: Medicare Other | Source: Ambulatory Visit | Attending: Internal Medicine | Admitting: Internal Medicine

## 2015-02-26 ENCOUNTER — Inpatient Hospital Stay: Payer: Medicare Other

## 2015-02-26 DIAGNOSIS — R410 Disorientation, unspecified: Secondary | ICD-10-CM | POA: Insufficient documentation

## 2015-02-26 LAB — BASIC METABOLIC PANEL
Anion gap: 4 — ABNORMAL LOW (ref 5–15)
BUN: 28 mg/dL — ABNORMAL HIGH (ref 6–20)
CALCIUM: 8.6 mg/dL — AB (ref 8.9–10.3)
CO2: 33 mmol/L — AB (ref 22–32)
CREATININE: 0.69 mg/dL (ref 0.61–1.24)
Chloride: 105 mmol/L (ref 101–111)
GFR calc non Af Amer: >60 mL/min (ref >60)
GLUCOSE: 100 mg/dL — AB (ref 65–99)
Potassium: 4 mmol/L (ref 3.5–5.1)
Sodium: 142 mmol/L (ref 135–145)

## 2015-02-26 LAB — CBC
HCT: 26.4 % — ABNORMAL LOW (ref 40.0–52.0)
Hemoglobin: 8.7 g/dL — ABNORMAL LOW (ref 13.0–18.0)
MCH: 32 pg (ref 26.0–34.0)
MCHC: 32.8 g/dL (ref 32.0–36.0)
MCV: 97.7 fL (ref 80.0–100.0)
PLATELETS: 212 10*3/uL (ref 150–440)
RBC: 2.7 MIL/uL — ABNORMAL LOW (ref 4.40–5.90)
RDW: 21.7 % — ABNORMAL HIGH (ref 11.5–14.5)
WBC: 8.6 10*3/uL (ref 3.8–10.6)

## 2015-02-26 LAB — SURGICAL PATHOLOGY

## 2015-02-26 MED ORDER — FUROSEMIDE 40 MG PO TABS: 40.0000 mg | ORAL_TABLET | Freq: Every day | ORAL | Status: DC

## 2015-02-26 MED ORDER — HYDROCODONE-ACETAMINOPHEN 5-325 MG PO TABS: 1.0000 | ORAL_TABLET | ORAL | Status: DC | PRN

## 2015-02-26 MED ORDER — DIPHENHYDRAMINE HCL 25 MG PO CAPS
25.0000 mg | ORAL_CAPSULE | Freq: Once | ORAL | Status: AC
  Administered 2015-02-26: 25 mg via ORAL
  Filled 2015-02-26: qty 1

## 2015-02-26 MED ORDER — HYDROMORPHONE HCL 1 MG/ML IJ SOLN: 0.5000 mg | INTRAMUSCULAR | Status: DC

## 2015-02-26 MED ORDER — IPRATROPIUM-ALBUTEROL 0.5-2.5 (3) MG/3ML IN SOLN
3.0000 mL | Freq: Once | RESPIRATORY_TRACT | Status: AC
  Administered 2015-02-26: 3 mL via RESPIRATORY_TRACT
  Filled 2015-02-26: qty 3

## 2015-02-26 NOTE — Discharge Summary (Signed)
[facility] NAME: [name]    MR#:  [id number]  DATE OF BIRTH:  1932-02-03  DATE OF ADMISSION:  02/24/2015 ADMITTING PHYSICIAN: Wayne Lighter, MD  DATE OF DISCHARGE: 02/26/15  PRIMARY CARE PHYSICIAN: BABAOFF, MARC E, MD    ADMISSION DIAGNOSIS:  Swelling [R60.9] Pain [R52] Lumbar compression fracture, closed, initial encounter ([name]) [S32.000A]  DISCHARGE DIAGNOSIS:  Active Problems:   Back pain   Pressure ulcer   SECONDARY DIAGNOSIS:   Past Medical History  Diagnosis Date  . Atrial fibrillation ([place]) 2007    s/p ablation  . Glaucoma   . Hypertension   . BPH (benign prostatic hypertrophy) 1989    s/p TURP  . History of chicken pox   . Microhematuria 1994    longstanding per pt, states normal w/u in past (has had cystoscopy, CT)  . Diastolic CHF ([name]) 0000000    grade 1  . Diverticulosis of colon (without mention of hemorrhage)   . Colon polyps 06/2012    multiple tubular adenomas, rec rpt 1 yr [name])  . CLL (chronic lymphocytic leukemia) ([name])   . Chronic anemia   . Pruritus     HOSPITAL COURSE:   [name] is a 82 y.o. male with a known history of low-grade lymphoma and chronic anemia requiring transfusions, history of atrial fibrillation status post ablation, hypertension and diastolic CHF presents from home secondary to worsening low back pain going on for 2 weeks now.  #1 Low back pain-  MRI showing L2-L3 compression fractures. - patient denies any trauma, however has been on prednisone. -Dr. Rudene Jacobson from orthopedics did kyphoplasty with significant improvement in back pain. -Patient doing well, worked with physical therapy. They recommended home health physical therapy however due to generalized weakness wife has preferred rehabilitation. -Patient is going to acute rehabilitation.  #2 acute on chronic anemia-baseline hemoglobin is around 8. 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 -Hemoglobin was 6.8 on admission. He did receive 1 unit packed RBC transfusion and hemoglobin has been stable at 8.9.  #3 low-grade lymphoma-following up with [facility]. Most recent PET scan 3 weeks ago has been stable. Revlimid stopped in December 2016. Family wishes to follow up with [facility].  #4 atrial fibrillation-had ablation and is in sinus rhythm now. Continue to monitor at this time. Not on anticoagulation. History of anemia noted.  #5 chronic diastolic CHF-last echo from 2016 with EF of 55%. Has vascular congestion on x-ray. -Continue to take Lasix every day. -Patient was supposed to be on home oxygen at nighttime however hasn't been using it. For now he'll need 2 L oxygen. Also encourage incentive spirometry. -Chest x-ray with bibasilar atelectasis and small right pleural effusion  #6 elevated troponin-likely demand ischemia from anemia and also tachycardia. -Stable.  Patient will be discharged to rehabilitation today  DISCHARGE CONDITIONS:   Guarded  CONSULTS OBTAINED:  Treatment Team:  Wayne Knows, MD  DRUG ALLERGIES:   Allergies  Allergen Reactions  . Rituximab Other (See Comments) and Shortness Of Breath    Passed out, sweating, shaky Passed out, sweating, shakey  . Penicillin G Potassium In D5w Rash  . Penicillins Rash    Tested negative, but reacted negatively    DISCHARGE MEDICATIONS:   Current Discharge Medication List    START taking these medications   Details  HYDROcodone-acetaminophen (NORCO/VICODIN) 5-325 MG tablet Take 1 tablet by mouth every 4 (four) hours as needed for moderate pain. Qty:  20 tablet, Refills: 0      CONTINUE these medications which have CHANGED   Details  furosemide (LASIX) 40 MG tablet Take 1 tablet (40 mg total) by mouth daily. Qty: 30 tablet, Refills: 0      CONTINUE these medications which have NOT CHANGED   Details  aspirin 81 MG tablet Take 81 mg by mouth daily.    cyclobenzaprine  (FLEXERIL) 5 MG tablet Take 1 tablet by mouth 3 (three) times daily as needed.    DiphenhydrAMINE HCl (BENADRYL ALLERGY PO) Take by mouth at bedtime.     docusate sodium (COLACE) 100 MG capsule Take 100 mg by mouth daily.     ferrous sulfate 325 (65 FE) MG tablet Take 325 mg by mouth 3 (three) times daily with meals.     hydrOXYzine (ATARAX/VISTARIL) 25 MG tablet Take 25 mg by mouth 3 (three) times daily.     lactulose (CHRONULAC) 10 GM/15ML solution Take 10 g by mouth daily.     loratadine (CLARITIN) 10 MG tablet Take 10 mg by mouth daily as needed.     potassium chloride SA (K-DUR,KLOR-CON) 20 MEQ tablet Take 20 mEq by mouth daily.    predniSONE (DELTASONE) 10 MG tablet Take 10-20 mg by mouth daily with breakfast.     psyllium (REGULOID) 0.52 G capsule Take 0.52 g by mouth daily.      STOP taking these medications     traMADol (ULTRAM) 50 MG tablet          DISCHARGE INSTRUCTIONS:   1. PCP f/u in 1 week 2. F/u with oncology at [facility] in 2 weeks 3. Physical therapy   If you experience worsening of your admission symptoms, develop shortness of breath, life threatening emergency, suicidal or homicidal thoughts you must seek medical attention immediately by calling 911 or calling your MD immediately  if symptoms less severe.  You Must read complete instructions/literature along with all the possible adverse reactions/side effects for all the Medicines you take and that have been prescribed to you. Take any new Medicines after you have completely understood and accept all the possible adverse reactions/side effects.   Please note  You were cared for by a hospitalist during your hospital stay. If you have any questions about your discharge medications or the care you received while you were in the hospital after you are discharged, you can call the unit and asked to speak with the hospitalist on call if the hospitalist that took care of you is not available. Once you are  discharged, your primary care physician will handle any further medical issues. Please note that NO REFILLS for any discharge medications will be authorized once you are discharged, as it is imperative that you return to your primary care physician (or establish a relationship with a primary care physician if you do not have one) for your aftercare needs so that they can reassess your need for medications and monitor your lab values.    Today   CHIEF COMPLAINT:   Chief Complaint  Patient presents with  . Leg Pain  . Back Pain    VITAL SIGNS:  Blood pressure 135/59, pulse 108, temperature 98.3 F (36.8 C), temperature source Oral, resp. rate 18, height 6' (1.829 m), weight 81.647 kg (180 lb), SpO2 98 %.  I/O:   Intake/Output Summary (Last 24 hours) at 02/26/15 1448 Last data filed at 02/26/15 1437  Gross per 24 hour  Intake 1241.67 ml  Output   1100 ml  Net  141.67 ml    PHYSICAL EXAMINATION:   Physical Exam  GENERAL: 82 y.o.-year-old patient lying in the bed with no acute distress.  EYES: Pupils equal, round, reactive to light and accommodation. No scleral icterus. Extraocular muscles intact.  HEENT: Head atraumatic, normocephalic. Oropharynx and nasopharynx clear.  NECK: Supple, no jugular venous distention. No thyroid enlargement, no tenderness.  LUNGS: Normal breath sounds bilaterally, no wheezing, rales,rhonchi or crepitation. No use of accessory muscles of respiration. Decreased bibasilar breath sounds. CARDIOVASCULAR: S1, S2 normal.  No rubs, or gallops. 3/6 systolic murmur is present. Right chest Port-A-Cath is present. ABDOMEN: Soft, nontender, nondistended. Bowel sounds present. No organomegaly or mass.  EXTREMITIES: No cyanosis, or clubbing. 2+ pitting pedal edema onto the knees NEUROLOGIC: Cranial nerves II through XII are intact. Muscle strength 5/5 in all extremities. Sensation intact. Gait not checked.  PSYCHIATRIC: The patient is alert and oriented x  3.  SKIN: No obvious rash, lesion, or ulcer.   DATA REVIEW:   CBC  Recent Labs Lab 02/26/15 0613  WBC 8.6  HGB 8.7*  HCT 26.4*  PLT 212    Chemistries   Recent Labs Lab 02/24/15 0918  02/26/15 0613  NA 139  < > 142  K 3.5  < > 4.0  CL 100*  < > 105  CO2 32  < > 33*  GLUCOSE 101*  < > 100*  BUN 23*  < > 28*  CREATININE 0.60*  < > 0.69  CALCIUM 8.7*  < > 8.6*  AST 13*  --   --   ALT 13*  --   --   ALKPHOS 85  --   --   BILITOT 1.0  --   --   < > = values in this interval not displayed.  Cardiac Enzymes  Recent Labs Lab 02/25/15 0554  TROPONINI 0.05*    Microbiology Results  Results for orders placed or performed during the hospital encounter of 02/24/15  Blood culture (routine x 2)     Status: None (Preliminary result)   Collection Time: 02/24/15  9:18 AM  Result Value Ref Range Status   Specimen Description BLOOD LEFT FA  Final   Special Requests   Final    BOTTLES DRAWN AEROBIC AND ANAEROBIC AER 1ML ANA .5ML   Culture NO GROWTH 2 DAYS  Final   Report Status PENDING  Incomplete  Blood culture (routine x 2)     Status: None (Preliminary result)   Collection Time: 02/24/15  9:27 AM  Result Value Ref Range Status   Specimen Description BLOOD RIGHT AC  Final   Special Requests   Final    BOTTLES DRAWN AEROBIC AND ANAEROBIC AER 2ML ANA 1ML   Culture NO GROWTH 2 DAYS  Final   Report Status PENDING  Incomplete    RADIOLOGY:  Dg Chest 2 View  02/26/2015  CLINICAL DATA:  Hypoxia, post kyphoplasty EXAM: CHEST  2 VIEW COMPARISON:  02/24/2015 FINDINGS: Cardiomegaly again noted. Stable right IJ Port-A-Cath position. There is moderate right pleural effusion with right basilar atelectasis or infiltrate. Small left pleural effusion left basilar atelectasis. Central mild vascular congestion without convincing pulmonary edema. IMPRESSION: Central mild vascular congestion without convincing pulmonary edema. Moderate right pleural effusion with right basilar atelectasis  or infiltrate. Small left pleural effusion left basilar atelectasis. Electronically Signed   By: Lahoma Crocker M.D.   On: 02/26/2015 11:44   Dg Lumbar Spine 2-3 Views  02/25/2015  CLINICAL DATA:  Kyphoplasty. EXAM: LUMBAR SPINE -  2-3 VIEW; DG C-ARM 61-120 MIN COMPARISON:  Lumbar spine films from 02/24/2015. FINDINGS: Two intraoperative spot fluoro films are submitted. Patient is status consecutive level vertebral augmentation of 2 vertebral bodies. There has not been enough bony anatomy included on the images to identify levels of treatment, but based on the previous exam, this likely represents L2 and L3. IMPRESSION: Intraoperative evaluation during vertebral augmentation. Electronically Signed   By: Misty Stanley M.D.   On: 02/25/2015 13:41   US Venous Img Lower Bilateral  02/24/2015  CLINICAL DATA:  Lower extremity swelling for 1.5 weeks. Pain and edema. EXAM: BILATERAL LOWER EXTREMITY VENOUS DOPPLER ULTRASOUND TECHNIQUE: Gray-scale sonography with graded compression, as well as color Doppler and duplex ultrasound were performed to evaluate the lower extremity deep venous systems from the level of the common femoral vein and including the common femoral, femoral, profunda femoral, popliteal and calf veins including the posterior tibial, peroneal and gastrocnemius veins when visible. The superficial great saphenous vein was also interrogated. Spectral Doppler was utilized to evaluate flow at rest and with distal augmentation maneuvers in the common femoral, femoral and popliteal veins. COMPARISON:  05/31/2013 FINDINGS: RIGHT LOWER EXTREMITY Common Femoral Vein: No evidence of thrombus. Normal compressibility, respiratory phasicity and response to augmentation. Saphenofemoral Junction: No evidence of thrombus. Normal compressibility and flow on color Doppler imaging. Profunda Femoral Vein: No evidence of thrombus. Normal compressibility and flow on color Doppler imaging. Femoral Vein: No evidence of thrombus.  Normal compressibility, respiratory phasicity and response to augmentation. Popliteal Vein: No evidence of thrombus. Normal compressibility, respiratory phasicity and response to augmentation. Calf Veins: No evidence of thrombus. Normal compressibility and flow on color Doppler imaging. Other Findings:  Prominent lymph node in the right groin. LEFT LOWER EXTREMITY Common Femoral Vein: No evidence of thrombus. Normal compressibility, respiratory phasicity and response to augmentation. Saphenofemoral Junction: No evidence of thrombus. Normal compressibility and flow on color Doppler imaging. Profunda Femoral Vein: No evidence of thrombus. Normal compressibility and flow on color Doppler imaging. Femoral Vein: No evidence of thrombus. Normal compressibility, respiratory phasicity and response to augmentation. Popliteal Vein: No evidence of thrombus. Normal compressibility, respiratory phasicity and response to augmentation. Calf Veins: No evidence of thrombus. Normal compressibility and flow on color Doppler imaging. Other Findings:  Multiple prominent lymph nodes in the left groin. IMPRESSION: No evidence of deep venous thrombosis in the lower extremities. Prominent lymph nodes in both groins. Findings are nonspecific and could be reactive. Electronically Signed   By: Markus Daft M.D.   On: 02/24/2015 16:58   Dg C-arm 1-60 Min  02/25/2015  CLINICAL DATA:  Kyphoplasty. EXAM: LUMBAR SPINE - 2-3 VIEW; DG C-ARM 61-120 MIN COMPARISON:  Lumbar spine films from 02/24/2015. FINDINGS: Two intraoperative spot fluoro films are submitted. Patient is status consecutive level vertebral augmentation of 2 vertebral bodies. There has not been enough bony anatomy included on the images to identify levels of treatment, but based on the previous exam, this likely represents L2 and L3. IMPRESSION: Intraoperative evaluation during vertebral augmentation. Electronically Signed   By: Misty Stanley M.D.   On: 02/25/2015 13:41    EKG:    Orders placed or performed during the hospital encounter of 02/24/15  . EKG 12-Lead  . EKG 12-Lead  . ED EKG  . ED EKG  . EKG 12-Lead  . EKG 12-Lead      Management plans discussed with the patient, family and they are in agreement.  CODE STATUS:     Code Status Orders  Start     Ordered   02/24/15 1748  Full code   Continuous     02/24/15 1747    Code Status History    Date Active Date Inactive Code Status Order ID Comments User Context   This patient has a current code status but no historical code status.    Advance Directive Documentation        Most Recent Value   Type of Advance Directive  Healthcare Power of Attorney, Living will   Pre-existing out of facility DNR order (yellow form or pink MOST form)     "MOST" Form in Place?        TOTAL TIME TAKING CARE OF THIS PATIENT: 38 minutes.    [name] M.D on 02/26/2015 at 2:48 PM  Between 7am to 6pm - Pager - [phone]  After 6pm go to [email or web address] - password [email or web address] [facility]  Office  [phone]  CC: Primary care physician; BABAOFF, Caryl Bis, MD

## 2015-02-26 NOTE — Care Management (Signed)
Dava RN will have O2 sat assessments done. Patient agrees to home O2 which has been arranged through [facility] if he qualifies. Per Dr. Tressia Miners patient does have CHF. I have asked for social worker and nursing assistant to be added to patient's [facility] POC. Corene Cornea with [facility] notified. Patient may discharge to home after O2 is delivered. No further RNCM needs. Case closed.

## 2015-02-26 NOTE — Progress Notes (Signed)
Pt. Request something additional to help him sleep. Dr. Jannifer Franklin ordered benadryl.

## 2015-02-26 NOTE — Care Management Important Message (Signed)
Important Message  Patient Details  Name: ROMELLO SPIELBERGER MRN: [id number] Date of Birth: Dec 17, 1932   Medicare Important Message Given:  Yes    Marshell Garfinkel, RN 02/26/2015, 8:40 AM

## 2015-02-26 NOTE — Clinical Social Work Note (Signed)
Clinical Social Work Assessment  Patient Details  Name: [name] MRN: [id number] Date of Birth: Apr 05, 1932  Date of referral:  02/26/15               Reason for consult:  Facility Placement                Permission sought to share information with:  Chartered certified accountant granted to share information::  Yes, Verbal Permission Granted  Name::      IT sales professional::   [place]   Relationship::     Contact Information:     Housing/Transportation Living arrangements for the past 2 months:  [place] of Information:  Patient, Adult Children, Spouse Patient Interpreter Needed:  None Criminal Activity/Legal Involvement Pertinent to Current Situation/Hospitalization:  No - Comment as needed Significant Relationships:  Adult Children Lives with:  Spouse Do you feel safe going back to the place where you live?  Yes Need for family participation in patient care:  Yes (Comment)  Care giving concerns:  Patient lives with his wife [place] in [place].    Social Worker assessment / plan:  Holiday representative (CSW) received verbal consult from PT that recommendation is home health however family is interested in SNF private pay. CSW met with patient and his wife Jolayne Haines and daughter Lenna Sciara were at bedside. CSW introduced self and explained role of CSW department. Patient was alert and oriented and sitting up in the bed. Wife reported that she understands that Medicare will not pay for short term rehab at a SNF however she feels that is the best thing for patient. Per wife patient has been home bound and not able to move around easily. Per patient he was in an experimental trial for cancer at [facility] however he had to quit. Wife reported that patient is not currently undergoing cancer treatment. CSW explained SNF private pay. Patient and wife reported that they can afford private pay and prefer [facility].   Per Maudie Mercury admissions coordinator  at [facility] they can accept patient today under private for. Per Maudie Mercury patient will have to pay 7 days up front for room and board ($2,583.00), supplies will be extra and billed separately, PT and OT will be billed to patient's Medicare part B. Patient has agreed to these terms.  Patient is medically stable for D/C to [facility] today. Per Maudie Mercury patient is going to room 221-B. RN will call report at [phone]. Patient's wife will provide transport with a borrowed portable oxygen tank from [facility]. CSW sent D/C Summary, FL2 and D/C Packet to [employer] via [place]. Patient's wife and daughter are aware of above. Please reconsult if future social work needs arise. CSW signing off.   Employment status:  Retired Forensic scientist:  Medicare PT Recommendations:  Home with [place] / Referral to community resources:  [place]  Patient/Family's Response to care: Patient and wife are agreeable to going to [facility] under private pay.   Patient/Family's Understanding of and Emotional Response to Diagnosis, Current Treatment, and Prognosis: Patient and family were pleasant throughout assessment.   Emotional Assessment Appearance:  Appears stated age Attitude/Demeanor/Rapport:    Affect (typically observed):  Accepting, Adaptable, Pleasant Orientation:  Oriented to Self, Oriented to Place, Oriented to  Time, Oriented to Situation Alcohol / Substance use:  Not Applicable Psych involvement (Current and /or in the community):  No (Comment)  Discharge Needs  Concerns to be addressed:  Discharge Planning Concerns  Readmission within the last 30 days:  No Current discharge risk:  None Barriers to Discharge:  No Barriers Identified   Loralyn Freshwater, LCSW 02/26/2015, 3:07 PM

## 2015-02-26 NOTE — Clinical Social Work Placement (Signed)
   CLINICAL SOCIAL WORK PLACEMENT  NOTE  Date:  02/26/2015  Patient Details  Name: [name] MRN: [id number] Date of Birth: November 24, 1932  Clinical Social Work is seeking post-discharge placement for this patient at the [place] level of care (*CSW will initial, date and re-position this form in  chart as items are completed):  Yes   Patient/family provided with [place] Work Department's list of facilities offering this level of care within the geographic area requested by the patient (or if unable, by the patient's family).  Yes   Patient/family informed of their freedom to choose among providers that offer the needed level of care, that participate in Medicare, Medicaid or managed care program needed by the patient, have an available bed and are willing to accept the patient.  Yes   Patient/family informed of [facility]'s ownership interest in [facility] and [facility], as well as of the fact that they are under no obligation to receive care at these facilities.  PASRR submitted to EDS on 02/25/15     PASRR number received on 02/25/15     Existing PASRR number confirmed on       FL2 transmitted to all facilities in geographic area requested by pt/family on 02/25/15     FL2 transmitted to all facilities within larger geographic area on       Patient informed that his/her managed care company has contracts with or will negotiate with certain facilities, including the following:        Yes   Patient/family informed of bed offers received.  Patient chooses bed at  [facility] )     Physician recommends and patient chooses bed at      Patient to be transferred to  [facility] ) on 02/26/15.  Patient to be transferred to facility by  (Patient's wife will transport in private vehicle. )     Patient family notified on 02/26/15 of transfer.  Name of family member notified:   (Patient's wife and daughter are aware of D/C  today. )     PHYSICIAN       Additional Comment:    _______________________________________________ Loralyn Freshwater, LCSW 02/26/2015, 3:06 PM

## 2015-02-26 NOTE — Progress Notes (Signed)
Physical Therapy Treatment Patient Details Name: [name] MRN: [id number] DOB: Nov 12, 1932 Today's Date: 02/26/2015    History of Present Illness Pt is admitted for acute compression fracture in L2/L3 and is now POD 1 from kyphoplasty.     PT Comments    Pt is making good progress towards goals with improved ambulation distance noted this date. Pt demonstrates safe technique with all mobility. Pt performed ambulation while on room air, sats decreasing to 86%. RN notified. No SOB symptoms noted. Pt educated and given written HEP, good endurance with there-ex.   Follow Up Recommendations  Home health PT     Equipment Recommendations       Recommendations for Other Services       Precautions / Restrictions Precautions Precautions: Fall Restrictions Weight Bearing Restrictions: No    Mobility  Bed Mobility Overal bed mobility: Needs Assistance Bed Mobility: Supine to Sit;Sit to Supine     Supine to sit: Min guard     General bed mobility comments: bed mobility given for safe technique including log rolling to get to EOB. During sit->supine, pt able to use trapeze bar to scoot up towards [facility] with cga and cues for assistance. Once seated at EOB, pt able to sit with independence.  Transfers Overall transfer level: Needs assistance Equipment used: Rolling walker (2 wheeled) Transfers: Sit to/from Stand Sit to Stand: Min guard         General transfer comment: Pt demonstrates safe technique with ability to push from seated surface. Once standing, pt uses rw correctly. Pt has some difficulty standing from lower surfaces, however is able to stand without assist. Recommend use of pillows to improve ease of transfers in home environement  Ambulation/Gait Ambulation/Gait assistance: Min guard Ambulation Distance (Feet): 80 Feet Assistive device: Rolling walker (2 wheeled) Gait Pattern/deviations: Step-to pattern     General Gait Details: Pt ambulated in hallway on room  air with O2 sats dropping to 86%. Pt reports slight fatigue however no SOB reported. Pt able to demonstrate upright posture with cues to keep head up.  Step to gait pattern performed during ambulation with no LOB noted. Pt reports no increased pain with movement   Stairs            Wheelchair Mobility    Modified Rankin (Stroke Patients Only)       Balance Overall balance assessment: Needs assistance Sitting-balance support: Feet supported Sitting balance-Leahy Scale: Normal     Standing balance support: Bilateral upper extremity supported Standing balance-Leahy Scale: Good                      Cognition Arousal/Alertness: Awake/alert Behavior During Therapy: WFL for tasks assessed/performed Overall Cognitive Status: Within Functional Limits for tasks assessed                      Exercises Other Exercises Other Exercises: Pt performed supine ther-ex including ankle pumps, SLRs, heel slides, hip abd/add, hip add squeezes, glut sets, and pelvic tilts. All ther-ex performed x 10 reps with supervision and cues for correct technique.    General Comments        Pertinent Vitals/Pain Pain Assessment: 0-10 Pain Score: 3  Pain Location: low back Pain Descriptors / Indicators: Operative site guarding Pain Intervention(s): Limited activity within patient's tolerance    Home Living Family/patient expects to be discharged to:: Private residence Living Arrangements: Spouse/significant other Available Help at Discharge: Family Type of Home: House Home Access: Level  entry   Home Layout: Able to live on main level with bedroom/bathroom Home Equipment: Walker - 2 wheels      Prior Function Level of Independence: Independent with assistive device(s)      Comments: used RW for majority of ambulation   PT Goals (current goals can now be found in the care plan section) Acute Rehab PT Goals Patient Stated Goal: to do whatever is best for me PT Goal  Formulation: With patient Time For Goal Achievement: 03/12/15 Potential to Achieve Goals: Good Additional Goals Additional Goal #1: Pt will be able to perform bed mobility/transfers with supervision and rw in order to improve functional mobility.  Progress towards PT goals: Progressing toward goals    Frequency  BID    PT Plan Current plan remains appropriate    Co-evaluation             End of Session Equipment Utilized During Treatment: Gait belt;Oxygen Activity Tolerance: Patient tolerated treatment well Patient left: in bed;with bed alarm set     Time: [id number] PT Time Calculation (min) (ACUTE ONLY): 47 min  Charges:  $Gait Training: 23-37 mins $Therapeutic Exercise: 8-22 mins                    G Codes:      Hanya Guerin 03/21/15, 3:03 PM  Greggory Stallion, PT, DPT [phone]

## 2015-02-26 NOTE — Care Management (Signed)
Received call from patient's wife [phone] and later met with her regarding her concerns for patient returning home. She offered to pay privately for SNF. Patient has had O2 at home but "wouldn't wear it and told them to come get it and they did". PT is recommending HHPT which he already has 2 times per week. I have requested that HHPT increase to 3 times per week. I do not currently see a chronic lung condition. RNCM will continue to follow.

## 2015-02-26 NOTE — Evaluation (Signed)
Physical Therapy Evaluation Patient Details Name: [name] MRN: [id number] DOB: Apr 21, 1932 Today's Date: 02/26/2015   History of Present Illness  Pt is admitted for acute compression fracture in L2/L3 and is now POD 1 from kyphoplasty.   Clinical Impression  Pt is a pleasant 82 year old male who was admitted for compression fractures of L2/L3 and now POD 1 from kyphoplasty. Pt performs bed mobility with cga, transfers with min assist, and ambulation with cga and rw. Pt demonstrates deficits with strength/pain/mobility. Pt educated in back precautions and follows commands well. Pt is motivated to perform therapy. Would benefit from skilled PT to address above deficits and promote optimal return to PLOF. Recommend transition to [place] upon discharge from acute hospitalization.       Follow Up Recommendations Home health PT    Equipment Recommendations       Recommendations for Other Services       Precautions / Restrictions Precautions Precautions: Fall Restrictions Weight Bearing Restrictions: No      Mobility  Bed Mobility Overal bed mobility: Needs Assistance Bed Mobility: Supine to Sit     Supine to sit: Min guard     General bed mobility comments: cues given for correct technique including log rolling in order to get to side of bed. Pt able to follow commands correctly. Once sitting at EOB, pt able to sit with supervision  Transfers Overall transfer level: Needs assistance Equipment used: Rolling walker (2 wheeled) Transfers: Sit to/from Stand Sit to Stand: Min assist         General transfer comment: Pt needs cues to scoot towards EOB, however is able to stand with safe technique.  Min assist required for obtaining upright posture  Ambulation/Gait Ambulation/Gait assistance: Min guard Ambulation Distance (Feet): 40 Feet Assistive device: Rolling walker (2 wheeled) Gait Pattern/deviations: Step-through pattern     General Gait Details: Pt ambulated in  room with mild pain demonstrating reciprocal gait pattern. Pt able to demonstrate upright cues and maintain back precautions during turns. Pt on room air for all mobility and has no complaints of SOB with exertion. hand held pulse ox unable to obtain accurate reading  Stairs            Wheelchair Mobility    Modified Rankin (Stroke Patients Only)       Balance Overall balance assessment: Needs assistance Sitting-balance support: Feet supported Sitting balance-Leahy Scale: Normal     Standing balance support: Bilateral upper extremity supported Standing balance-Leahy Scale: Good                               Pertinent Vitals/Pain Pain Assessment: 0-10 Pain Score: 3  Pain Location: low back Pain Descriptors / Indicators: Aching;Dull;Discomfort Pain Intervention(s): Limited activity within patient's tolerance    Home Living Family/patient expects to be discharged to:: Private residence Living Arrangements: Spouse/significant other Available Help at Discharge: Family Type of Home: House Home Access: Level entry     Home Layout: Able to live on main level with bedroom/bathroom Home Equipment: Environmental consultant - 2 wheels      Prior Function Level of Independence: Independent with assistive device(s)         Comments: used RW for majority of ambulation     Hand Dominance        Extremity/Trunk Assessment   Upper Extremity Assessment: Overall WFL for tasks assessed           Lower Extremity Assessment:  Generalized weakness (grossly 4/5)         Communication   Communication: No difficulties  Cognition Arousal/Alertness: Awake/alert Behavior During Therapy: WFL for tasks assessed/performed Overall Cognitive Status: Within Functional Limits for tasks assessed                      General Comments      Exercises        Assessment/Plan    PT Assessment Patient needs continued PT services  PT Diagnosis Difficulty  walking;Abnormality of gait   PT Problem List Decreased strength;Decreased activity tolerance;Decreased mobility;Decreased coordination  PT Treatment Interventions Gait training;Therapeutic exercise   PT Goals (Current goals can be found in the Care Plan section) Acute Rehab PT Goals Patient Stated Goal: to go home PT Goal Formulation: With patient Time For Goal Achievement: 03/12/15 Potential to Achieve Goals: Good    Frequency BID   Barriers to discharge        Co-evaluation               End of Session Equipment Utilized During Treatment: Gait belt Activity Tolerance: Patient tolerated treatment well Patient left: in chair;with chair alarm set Nurse Communication: Mobility status         Time: [id number] PT Time Calculation (min) (ACUTE ONLY): 21 min   Charges:   PT Evaluation $PT Eval Moderate Complexity: 1 Procedure     PT G Codes:        Jeren Dufrane 03/06/2015, 12:17 PM  Greggory Stallion, PT, DPT [phone]

## 2015-02-26 NOTE — Anesthesia Postprocedure Evaluation (Signed)
Anesthesia Post Note  Patient: [name]  Procedure(s) Performed: Procedure(s) (LRB): KYPHOPLASTY L2,L3 (N/A)  Patient location during evaluation: PACU Anesthesia Type: General Level of consciousness: awake and alert Pain management: pain level controlled Vital Signs Assessment: post-procedure vital signs reviewed and stable Respiratory status: spontaneous breathing, nonlabored ventilation, respiratory function stable and patient connected to nasal cannula oxygen Cardiovascular status: blood pressure returned to baseline and stable Postop Assessment: no signs of nausea or vomiting Anesthetic complications: no Comments: Patient was initially difficult to arouse, which was likely from the midazolam that the patient received intraop.  Patient was much more awake and alert after receiving a small dose of flumazenil.  No further issues.    Last Vitals:  Filed Vitals:   02/26/15 0415 02/26/15 0804  BP: 123/62 135/59  Pulse: 106 108  Temp: 36.8 C 36.8 C  Resp: 18 18    Last Pain:  Filed Vitals:   02/26/15 0916  PainSc: 3                  Martha Clan

## 2015-02-26 NOTE — Progress Notes (Signed)
Report called to shirley at [facility] . Left via ems

## 2015-02-27 DIAGNOSIS — R410 Disorientation, unspecified: Secondary | ICD-10-CM | POA: Diagnosis present

## 2015-02-27 LAB — URINALYSIS COMPLETE WITH MICROSCOPIC (ARMC ONLY)
Bilirubin Urine: NEGATIVE
GLUCOSE, UA: NEGATIVE mg/dL
HGB URINE DIPSTICK: NEGATIVE
NITRITE: NEGATIVE
PH: 6 (ref 5.0–8.0)
Protein, ur: NEGATIVE mg/dL
SPECIFIC GRAVITY, URINE: 1.02 (ref 1.005–1.030)

## 2015-03-01 LAB — URINE CULTURE: CULTURE: NO GROWTH

## 2015-03-02 NOTE — Progress Notes (Signed)
Patient's wife [name] called Clinical Social Worker (CSW) this morning Monday 03/02/15 and asked why Medicare would not pay for patient's stay at [facility]. CSW explained to wife again for the second time that in order for traditional Medicare to pay for rehab at SNF a patient must have a 3 night qualifying inpatient stay at a hospital and PT has to recommend SNF level of care. This patient did not have a 3 night inpatient qualifying stay and PT recommended home health. CSW explained to wife that is why going to [facility] under private pay was the only option. Wife verbalized her understanding and thanked CSW for explaining SNF criteria.   Blima Rich, LCSW [phone]

## 2015-03-03 LAB — CULTURE, BLOOD (ROUTINE X 2)
Culture: NO GROWTH
Culture: NO GROWTH

## 2015-03-08 DIAGNOSIS — R410 Disorientation, unspecified: Secondary | ICD-10-CM | POA: Diagnosis not present

## 2015-03-08 LAB — CBC WITH DIFFERENTIAL/PLATELET
BASOS ABS: 0 10*3/uL (ref 0–0.1)
BASOS PCT: 0 %
Band Neutrophils: 0 %
Blasts: 0 %
EOS PCT: 0 %
Eosinophils Absolute: 0 10*3/uL (ref 0–0.7)
HCT: 25.1 % — ABNORMAL LOW (ref 40.0–52.0)
Hemoglobin: 8.3 g/dL — ABNORMAL LOW (ref 13.0–18.0)
LYMPHS ABS: 0.8 10*3/uL — AB (ref 1.0–3.6)
Lymphocytes Relative: 8 %
MCH: 31.7 pg (ref 26.0–34.0)
MCHC: 32.8 g/dL (ref 32.0–36.0)
MCV: 96.6 fL (ref 80.0–100.0)
METAMYELOCYTES PCT: 0 %
MONO ABS: 1.3 10*3/uL — AB (ref 0.2–1.0)
MYELOCYTES: 0 %
Monocytes Relative: 13 %
Neutro Abs: 7.7 10*3/uL — ABNORMAL HIGH (ref 1.4–6.5)
Neutrophils Relative %: 79 %
Other: 0 %
PLATELETS: 263 10*3/uL (ref 150–440)
PROMYELOCYTES ABS: 0 %
RBC: 2.6 MIL/uL — ABNORMAL LOW (ref 4.40–5.90)
RDW: 20.2 % — ABNORMAL HIGH (ref 11.5–14.5)
WBC: 9.8 10*3/uL (ref 3.8–10.6)
nRBC: 0 /100 WBC

## 2015-03-08 LAB — COMPREHENSIVE METABOLIC PANEL
ALBUMIN: 2.4 g/dL — AB (ref 3.5–5.0)
ALT: 31 U/L (ref 17–63)
AST: 27 U/L (ref 15–41)
Alkaline Phosphatase: 144 U/L — ABNORMAL HIGH (ref 38–126)
Anion gap: 9 (ref 5–15)
BUN: 21 mg/dL — AB (ref 6–20)
CO2: 31 mmol/L (ref 22–32)
CREATININE: 0.89 mg/dL (ref 0.61–1.24)
Calcium: 8.5 mg/dL — ABNORMAL LOW (ref 8.9–10.3)
Chloride: 101 mmol/L (ref 101–111)
GFR calc Af Amer: >60 mL/min (ref >60)
GFR calc non Af Amer: >60 mL/min (ref >60)
GLUCOSE: 146 mg/dL — AB (ref 65–99)
Potassium: 3.8 mmol/L (ref 3.5–5.1)
SODIUM: 141 mmol/L (ref 135–145)
Total Bilirubin: 0.8 mg/dL (ref 0.3–1.2)
Total Protein: 6.6 g/dL (ref 6.5–8.1)

## 2015-03-09 ENCOUNTER — Other Ambulatory Visit: Payer: Self-pay | Admitting: Internal Medicine

## 2015-03-09 DIAGNOSIS — M25562 Pain in left knee: Secondary | ICD-10-CM

## 2015-03-10 ENCOUNTER — Ambulatory Visit
Admission: RE | Admit: 2015-03-10 | Discharge: 2015-03-10 | Disposition: A | Discharge status: Home / Self Care | Payer: Medicare Other | Source: Ambulatory Visit | Attending: Internal Medicine | Admitting: Internal Medicine

## 2015-03-10 ENCOUNTER — Encounter: Payer: Self-pay | Admitting: Urology

## 2015-03-10 ENCOUNTER — Ambulatory Visit (INDEPENDENT_AMBULATORY_CARE_PROVIDER_SITE_OTHER): Payer: Medicare Other | Admitting: Urology

## 2015-03-10 VITALS — BP 122/62 | HR 125 | Ht 72.0 in | Wt 170.3 lb

## 2015-03-10 DIAGNOSIS — C9110 Chronic lymphocytic leukemia of B-cell type not having achieved remission: Secondary | ICD-10-CM | POA: Insufficient documentation

## 2015-03-10 DIAGNOSIS — M8008XD Age-related osteoporosis with current pathological fracture, vertebra(e), subsequent encounter for fracture with routine healing: Secondary | ICD-10-CM | POA: Diagnosis present

## 2015-03-10 DIAGNOSIS — M6281 Muscle weakness (generalized): Secondary | ICD-10-CM | POA: Insufficient documentation

## 2015-03-10 DIAGNOSIS — S83242A Other tear of medial meniscus, current injury, left knee, initial encounter: Secondary | ICD-10-CM | POA: Insufficient documentation

## 2015-03-10 DIAGNOSIS — N489 Disorder of penis, unspecified: Secondary | ICD-10-CM

## 2015-03-10 DIAGNOSIS — M659 Synovitis and tenosynovitis, unspecified: Secondary | ICD-10-CM | POA: Diagnosis not present

## 2015-03-10 DIAGNOSIS — M25562 Pain in left knee: Secondary | ICD-10-CM

## 2015-03-10 DIAGNOSIS — M25461 Effusion, right knee: Secondary | ICD-10-CM | POA: Insufficient documentation

## 2015-03-10 DIAGNOSIS — E8809 Other disorders of plasma-protein metabolism, not elsewhere classified: Secondary | ICD-10-CM | POA: Diagnosis not present

## 2015-03-10 DIAGNOSIS — N4889 Other specified disorders of penis: Secondary | ICD-10-CM | POA: Insufficient documentation

## 2015-03-10 NOTE — Progress Notes (Signed)
03/10/2015 9:20 AM   [name] 02/07/1932 [id number]  Referring provider: Derinda Late, MD [phone] [address] [address] [facility] [place], [place] [address]  No chief complaint on file.   HPI:  1 - Penile Growth - Slowly progressive inner foreskin leaflet aout 1.5cm fleshy growth x 3 years. Some small encroachement on to glans noted by pt. Does not interfear with voiding. No hematuria. No penile pain. He has bilat palpable groin adenopathy that has been stable x years and  PET CT 01/2015 with stable diffuse adenopathy (retroperitoneal, Rt inguinal) with low PET avidity.  Minimal bother.   2 - Microscopic Hematuria - underwent negative eval in 1990s per reports with upper tract imaging and cysto. No gross episodes.  PMH sig for AFib/Ablation, CLL (transfusion dependant, chronic steroids, chemo), L-spine FX/Kyphoplasty, COPD, CHF/Lasix, Failure to thrive / lives in assisted living, barely ambulatory. His PCP is Dr. Loney Hering with [facility].  Today "[name]" is seen as new patient for above.    PMH: Past Medical History  Diagnosis Date  . Atrial fibrillation ([place]) 2007    s/p ablation  . Glaucoma   . Hypertension   . BPH (benign prostatic hypertrophy) 1989    s/p TURP  . History of chicken pox   . Microhematuria 1994    longstanding per pt, states normal w/u in past (has had cystoscopy, CT)  . Diastolic CHF ([place]) 0000000    grade 1  . Diverticulosis of colon (without mention of hemorrhage)   . Colon polyps 06/2012    multiple tubular adenomas, rec rpt 1 yr [facility])  . CLL (chronic lymphocytic leukemia) ([place])   . Chronic anemia   . Pruritus     Surgical History: Past Surgical History  Procedure Laterality Date  . Hernia repair  x6    inguinal, ventral  . Cataract extraction, bilateral    . Umbilical hernia repair      strangulated  . Radiofrequency ablation  2007    Afib  . Transurethral resection of prostate  1989    BPH  . Colonoscopy   06/2008    no polyps, rec rpt 4-5 yrs given hx polyps (done in [facility])  . US echocardiography  04/2012    EF 55-60%, normal systol fxn, grade 1 diastolic dysfunction, mildly dilated LA, mild pulm HTN  . Carotid US  04/2012    mild plaque bilaterally (0-39%)  . Colonoscopy  06/2012    adenomatous polyp, severe diverticulosis, rec rpt 1 yr [facility])  . Esophagogastroduodenoscopy  06/2012    duodenal divertic, o/w WNL [facility])  . Cardiac catheterization  2007  . Kyphoplasty N/A 02/25/2015    Procedure: KYPHOPLASTY L2,L3;  Surgeon: Hessie Knows, MD;  Location: [facility] ORS;  Service: Orthopedics;  Laterality: N/A;    Home Medications:    Medication List       This list is accurate as of: 03/10/15  9:20 AM.  Always use your most recent med list.               aspirin 81 MG tablet  Take 81 mg by mouth daily.     BENADRYL ALLERGY PO  Take by mouth at bedtime.     cyclobenzaprine 5 MG tablet  Commonly known as:  FLEXERIL  Take 1 tablet by mouth 3 (three) times daily as needed.     docusate sodium 100 MG capsule  Commonly known as:  COLACE  Take 100 mg by mouth daily.  ferrous sulfate 325 (65 FE) MG tablet  Take 325 mg by mouth 3 (three) times daily with meals.     furosemide 40 MG tablet  Commonly known as:  LASIX  Take 1 tablet (40 mg total) by mouth daily.     HYDROcodone-acetaminophen 5-325 MG tablet  Commonly known as:  NORCO/VICODIN  Take 1 tablet by mouth every 4 (four) hours as needed for moderate pain.     hydrOXYzine 25 MG tablet  Commonly known as:  ATARAX/VISTARIL  Take 25 mg by mouth 3 (three) times daily.     lactulose 10 GM/15ML solution  Commonly known as:  CHRONULAC  Take 10 g by mouth daily.     loratadine 10 MG tablet  Commonly known as:  CLARITIN  Take 10 mg by mouth daily as needed.     potassium chloride SA 20 MEQ tablet  Commonly known as:  K-DUR,KLOR-CON  Take 20 mEq by mouth daily.     predniSONE 10 MG tablet  Commonly known as:   DELTASONE  Take 10-20 mg by mouth daily with breakfast.     psyllium 0.52 g capsule  Commonly known as:  REGULOID  Take 0.52 g by mouth daily.        Allergies:  Allergies  Allergen Reactions  . Rituximab Other (See Comments) and Shortness Of Breath    Passed out, sweating, shaky Passed out, sweating, shakey  . Penicillin G Potassium In D5w Rash  . Penicillins Rash    Tested negative, but reacted negatively    Family History: Family History  Problem Relation Age of Onset  . Adopted: Yes  . Cancer Father     lung, smoker  . Stroke Mother   . Diabetes Brother   . CAD Neg Hx     Social History:  reports that he quit smoking about 22 years ago. His smoking use included Cigarettes. He started smoking about 67 years ago. He has a 40 pack-year smoking history. He has never used smokeless tobacco. He reports that he does not drink alcohol or use illicit drugs.  ROS:   Review of Systems  Gastrointestinal (upper)  : Negative for upper GI symptoms  Gastrointestinal (lower) : Negative for lower GI symptoms  Constitutional : Fatigue  Skin: Negative for skin symptoms  Eyes: Negative for eye symptoms  Ear/Nose/Throat : Negative for Ear/Nose/Throat symptoms  Hematologic/Lymphatic: Negative for Hematologic/Lymphatic symptoms  Cardiovascular : Leg swelling  Respiratory : Shortness of breath  Endocrine: Negative for endocrine symptoms  Musculoskeletal: Negative for musculoskeletal symptoms  Neurological: Negative for neurological symptoms  Psychologic: Negative for psychiatric symptoms       Physical Exam: There were no vitals taken for this visit.  Constitutional:  Alert and oriented, No acute distress. He is cachectic, ill-appearing. Wife accompanies him.  HEENT: [place] AT, moist mucus membranes.  Trachea midline, no masses. Cardiovascular: No clubbing, cyanosis, or edema. Respiratory: Normal respiratory effort, no increased work of breathing. GI:  Abdomen is soft, nontender, nondistended, no abdominal masses GU: No CVA tenderness. 1.5cm fleshy lesion Rt inner foreskin leaflet with few mm incroahmetn on to Rt glans. Non-friable. Non-firm. Bilat mobile inguinal adenopathy. Skin: No rashes, bruises or suspicious lesions. Lymph: No cervical or inguinal adenopathy. Neurologic: Grossly intact, no focal deficits, moving all 4 extremities. Psychiatric: Normal mood and affect.  Laboratory Data: Lab Results  Component Value Date   WBC 9.8 03/08/2015   HGB 8.3* 03/08/2015   HCT 25.1* 03/08/2015   MCV 96.6 03/08/2015  PLT 263 03/08/2015    Lab Results  Component Value Date   CREATININE 0.89 03/08/2015    Lab Results  Component Value Date   PSA 0.5 01/26/2011    No results found for: TESTOSTERONE  Lab Results  Component Value Date   HGBA1C 5.5 08/29/2014    Urinalysis    Component Value Date/Time   COLORURINE YELLOW* 02/27/2015 0900   COLORURINE YELLOW 07/28/2013 2326   APPEARANCEUR CLEAR* 02/27/2015 0900   APPEARANCEUR CLEAR 07/28/2013 2326   LABSPEC 1.020 02/27/2015 0900   LABSPEC 1.017 07/28/2013 2326   PHURINE 6.0 02/27/2015 0900   PHURINE 5.0 07/28/2013 2326   GLUCOSEU NEGATIVE 02/27/2015 0900   GLUCOSEU NEGATIVE 07/28/2013 2326   HGBUR NEGATIVE 02/27/2015 0900   HGBUR NEGATIVE 07/28/2013 2326   BILIRUBINUR NEGATIVE 02/27/2015 0900   BILIRUBINUR NEGATIVE 07/28/2013 2326   KETONESUR TRACE* 02/27/2015 0900   KETONESUR NEGATIVE 07/28/2013 2326   PROTEINUR NEGATIVE 02/27/2015 0900   PROTEINUR NEGATIVE 07/28/2013 2326   NITRITE NEGATIVE 02/27/2015 0900   NITRITE NEGATIVE 07/28/2013 2326   LEUKOCYTESUR TRACE* 02/27/2015 0900   LEUKOCYTESUR NEGATIVE 07/28/2013 2326    Pertinent Imaging: As per HPI  Assessment & Plan:    1 - Penile Growth - Frankly discussed with pt and wife DDX primary penile cancer (felt most likely), v. GU wart v. Cutaneous manifestation of lymphoma (felt least likely).   He has very  poor functional capacity and adamantly refueses any sort of surgical intervention. Discussed aggressive path with elective circumcision / glans BX with goal of tissue diagnosis and local control v. Prn management with consider prior should interfear with voiding or functionality. Pt and wife adamantly want prn / palliative approach. They understand that this may represent another malignancy. Given his overall very poor functional status this does seem appropriate. We also discussed office BX and potential topical therapy but they also decline.  2 - Microscopic Hematuria - consider further eval for future gross episodes only.   3 - RTC Urol prn  No Follow-up on file.  Alexis Frock, [facility] [address] [place], [place] [address] [phone]

## 2015-03-12 DIAGNOSIS — R410 Disorientation, unspecified: Secondary | ICD-10-CM | POA: Diagnosis not present

## 2015-03-12 LAB — CBC WITH DIFFERENTIAL/PLATELET
BAND NEUTROPHILS: 0 %
BLASTS: 0 %
Basophils Absolute: 0.1 10*3/uL (ref 0–0.1)
Basophils Relative: 1 %
Eosinophils Absolute: 0 10*3/uL (ref 0–0.7)
Eosinophils Relative: 0 %
HEMATOCRIT: 22.5 % — AB (ref 40.0–52.0)
HEMOGLOBIN: 7.3 g/dL — AB (ref 13.0–18.0)
LYMPHS PCT: 24 %
Lymphs Abs: 1.8 10*3/uL (ref 1.0–3.6)
MCH: 31.7 pg (ref 26.0–34.0)
MCHC: 32.3 g/dL (ref 32.0–36.0)
MCV: 98.3 fL (ref 80.0–100.0)
MONOS PCT: 12 %
Metamyelocytes Relative: 0 %
Monocytes Absolute: 0.9 10*3/uL (ref 0.2–1.0)
Myelocytes: 0 %
NEUTROS ABS: 4.9 10*3/uL (ref 1.4–6.5)
NEUTROS PCT: 63 %
NRBC: 0 /100{WBCs}
OTHER: 0 %
Platelets: 243 10*3/uL (ref 150–440)
Promyelocytes Absolute: 0 %
RBC: 2.29 MIL/uL — AB (ref 4.40–5.90)
RDW: 20.1 % — AB (ref 11.5–14.5)
WBC: 7.7 10*3/uL (ref 3.8–10.6)

## 2015-03-12 LAB — BASIC METABOLIC PANEL
ANION GAP: 5 (ref 5–15)
BUN: 22 mg/dL — ABNORMAL HIGH (ref 6–20)
CO2: 34 mmol/L — ABNORMAL HIGH (ref 22–32)
Calcium: 8.6 mg/dL — ABNORMAL LOW (ref 8.9–10.3)
Chloride: 103 mmol/L (ref 101–111)
Creatinine, Ser: 0.68 mg/dL (ref 0.61–1.24)
Glucose, Bld: 85 mg/dL (ref 65–99)
POTASSIUM: 4 mmol/L (ref 3.5–5.1)
SODIUM: 142 mmol/L (ref 135–145)

## 2015-03-13 ENCOUNTER — Ambulatory Visit
Admission: AD | Admit: 2015-03-13 | Discharge: 2015-03-14 | Disposition: A | Discharge status: Skilled Nursing Facility | Payer: Medicare Other | Source: Ambulatory Visit | Attending: Internal Medicine | Admitting: Internal Medicine

## 2015-03-13 ENCOUNTER — Other Ambulatory Visit: Payer: Self-pay | Admitting: Internal Medicine

## 2015-03-13 DIAGNOSIS — T8092XA Unspecified transfusion reaction, initial encounter: Secondary | ICD-10-CM | POA: Diagnosis not present

## 2015-03-13 DIAGNOSIS — Z87891 Personal history of nicotine dependence: Secondary | ICD-10-CM | POA: Diagnosis not present

## 2015-03-13 DIAGNOSIS — Z79899 Other long term (current) drug therapy: Secondary | ICD-10-CM | POA: Diagnosis not present

## 2015-03-13 DIAGNOSIS — R0989 Other specified symptoms and signs involving the circulatory and respiratory systems: Secondary | ICD-10-CM | POA: Diagnosis not present

## 2015-03-13 DIAGNOSIS — Z880 Allergy status to penicillin: Secondary | ICD-10-CM | POA: Diagnosis not present

## 2015-03-13 DIAGNOSIS — I4891 Unspecified atrial fibrillation: Secondary | ICD-10-CM | POA: Insufficient documentation

## 2015-03-13 DIAGNOSIS — Z7982 Long term (current) use of aspirin: Secondary | ICD-10-CM | POA: Diagnosis not present

## 2015-03-13 DIAGNOSIS — H409 Unspecified glaucoma: Secondary | ICD-10-CM | POA: Insufficient documentation

## 2015-03-13 DIAGNOSIS — M25561 Pain in right knee: Secondary | ICD-10-CM | POA: Diagnosis not present

## 2015-03-13 DIAGNOSIS — X58XXXA Exposure to other specified factors, initial encounter: Secondary | ICD-10-CM | POA: Insufficient documentation

## 2015-03-13 DIAGNOSIS — I5032 Chronic diastolic (congestive) heart failure: Secondary | ICD-10-CM | POA: Insufficient documentation

## 2015-03-13 DIAGNOSIS — R059 Cough, unspecified: Secondary | ICD-10-CM

## 2015-03-13 DIAGNOSIS — M25562 Pain in left knee: Secondary | ICD-10-CM | POA: Insufficient documentation

## 2015-03-13 DIAGNOSIS — K5790 Diverticulosis of intestine, part unspecified, without perforation or abscess without bleeding: Secondary | ICD-10-CM | POA: Insufficient documentation

## 2015-03-13 DIAGNOSIS — D649 Anemia, unspecified: Secondary | ICD-10-CM | POA: Diagnosis present

## 2015-03-13 DIAGNOSIS — I10 Essential (primary) hypertension: Secondary | ICD-10-CM | POA: Insufficient documentation

## 2015-03-13 DIAGNOSIS — M1990 Unspecified osteoarthritis, unspecified site: Secondary | ICD-10-CM | POA: Insufficient documentation

## 2015-03-13 DIAGNOSIS — I517 Cardiomegaly: Secondary | ICD-10-CM | POA: Diagnosis not present

## 2015-03-13 DIAGNOSIS — R0602 Shortness of breath: Secondary | ICD-10-CM | POA: Diagnosis not present

## 2015-03-13 DIAGNOSIS — D630 Anemia in neoplastic disease: Secondary | ICD-10-CM | POA: Diagnosis not present

## 2015-03-13 DIAGNOSIS — C9110 Chronic lymphocytic leukemia of B-cell type not having achieved remission: Secondary | ICD-10-CM | POA: Insufficient documentation

## 2015-03-13 DIAGNOSIS — R05 Cough: Secondary | ICD-10-CM | POA: Diagnosis not present

## 2015-03-13 DIAGNOSIS — Z888 Allergy status to other drugs, medicaments and biological substances status: Secondary | ICD-10-CM | POA: Diagnosis not present

## 2015-03-13 DIAGNOSIS — R410 Disorientation, unspecified: Secondary | ICD-10-CM | POA: Diagnosis not present

## 2015-03-13 DIAGNOSIS — L299 Pruritus, unspecified: Secondary | ICD-10-CM | POA: Insufficient documentation

## 2015-03-13 DIAGNOSIS — J90 Pleural effusion, not elsewhere classified: Secondary | ICD-10-CM | POA: Diagnosis not present

## 2015-03-13 DIAGNOSIS — M545 Low back pain: Secondary | ICD-10-CM | POA: Diagnosis not present

## 2015-03-13 DIAGNOSIS — N400 Enlarged prostate without lower urinary tract symptoms: Secondary | ICD-10-CM | POA: Diagnosis not present

## 2015-03-13 DIAGNOSIS — Z86010 Personal history of colonic polyps: Secondary | ICD-10-CM | POA: Diagnosis not present

## 2015-03-13 DIAGNOSIS — Y848 Other medical procedures as the cause of abnormal reaction of the patient, or of later complication, without mention of misadventure at the time of the procedure: Secondary | ICD-10-CM | POA: Insufficient documentation

## 2015-03-13 LAB — CBC WITH DIFFERENTIAL/PLATELET
BASOS PCT: 1 %
Basophils Absolute: 0.1 10*3/uL (ref 0–0.1)
EOS PCT: 3 %
Eosinophils Absolute: 0.2 10*3/uL (ref 0–0.7)
HCT: 21.3 % — ABNORMAL LOW (ref 40.0–52.0)
HEMOGLOBIN: 7 g/dL — AB (ref 13.0–18.0)
LYMPHS PCT: 12 %
Lymphs Abs: 0.9 10*3/uL — ABNORMAL LOW (ref 1.0–3.6)
MCH: 31.4 pg (ref 26.0–34.0)
MCHC: 32.6 g/dL (ref 32.0–36.0)
MCV: 96.1 fL (ref 80.0–100.0)
MONO ABS: 1.8 10*3/uL — AB (ref 0.2–1.0)
Monocytes Relative: 24 %
NEUTROS ABS: 4.5 10*3/uL (ref 1.4–6.5)
NEUTROS PCT: 60 %
Platelets: 229 10*3/uL (ref 150–440)
RBC: 2.22 MIL/uL — ABNORMAL LOW (ref 4.40–5.90)
RDW: 19.5 % — ABNORMAL HIGH (ref 11.5–14.5)
WBC: 7.5 10*3/uL (ref 3.8–10.6)

## 2015-03-13 LAB — PREPARE RBC (CROSSMATCH)

## 2015-03-13 MED ORDER — DIPHENHYDRAMINE HCL 25 MG PO TABS
25.0000 mg | ORAL_TABLET | Freq: Every evening | ORAL | Status: DC | PRN
  Filled 2015-03-13: qty 1

## 2015-03-13 MED ORDER — TRIAMCINOLONE ACETONIDE 0.5 % EX CREA
TOPICAL_CREAM | Freq: Two times a day (BID) | CUTANEOUS | Status: DC
  Filled 2015-03-13: qty 15

## 2015-03-13 MED ORDER — SODIUM CHLORIDE 0.9 % IV SOLN
Freq: Once | INTRAVENOUS | Status: AC
  Administered 2015-03-13: 18:00:00 via INTRAVENOUS

## 2015-03-13 MED ORDER — LORATADINE 10 MG PO TABS: 10.0000 mg | ORAL_TABLET | Freq: Every day | ORAL | Status: DC | PRN

## 2015-03-13 MED ORDER — FUROSEMIDE 10 MG/ML IJ SOLN
40.0000 mg | Freq: Once | INTRAMUSCULAR | Status: AC
Start: 2015-03-13 — End: 2015-03-13
  Administered 2015-03-13: 40 mg via INTRAVENOUS
  Filled 2015-03-13: qty 4

## 2015-03-13 MED ORDER — FLUCONAZOLE 100 MG PO TABS
100.0000 mg | ORAL_TABLET | Freq: Every day | ORAL | Status: DC
  Administered 2015-03-14: 100 mg via ORAL
  Filled 2015-03-13 (×2): qty 1

## 2015-03-13 MED ORDER — LACTULOSE 10 GM/15ML PO SOLN
10.0000 g | Freq: Every day | ORAL | Status: DC
  Administered 2015-03-14: 10 g via ORAL
  Filled 2015-03-13: qty 30

## 2015-03-13 MED ORDER — CIPROFLOXACIN HCL 500 MG PO TABS
500.0000 mg | ORAL_TABLET | Freq: Every day | ORAL | Status: DC
  Administered 2015-03-14: 500 mg via ORAL
  Filled 2015-03-13: qty 1

## 2015-03-13 MED ORDER — PREDNISONE 20 MG PO TABS
20.0000 mg | ORAL_TABLET | Freq: Every day | ORAL | Status: DC
  Administered 2015-03-14: 20 mg via ORAL
  Filled 2015-03-13: qty 1

## 2015-03-13 MED ORDER — FLUOXETINE HCL 10 MG PO CAPS
10.0000 mg | ORAL_CAPSULE | Freq: Every day | ORAL | Status: DC
  Administered 2015-03-14: 10 mg via ORAL
  Filled 2015-03-13 (×2): qty 1

## 2015-03-13 MED ORDER — FERROUS SULFATE 325 (65 FE) MG PO TABS
325.0000 mg | ORAL_TABLET | Freq: Three times a day (TID) | ORAL | Status: DC
  Administered 2015-03-13 – 2015-03-14 (×3): 325 mg via ORAL
  Filled 2015-03-13 (×3): qty 1

## 2015-03-13 MED ORDER — CYCLOBENZAPRINE HCL 10 MG PO TABS: 5.0000 mg | ORAL_TABLET | Freq: Three times a day (TID) | ORAL | Status: DC | PRN

## 2015-03-13 MED ORDER — METOPROLOL TARTRATE 25 MG PO TABS
25.0000 mg | ORAL_TABLET | Freq: Two times a day (BID) | ORAL | Status: DC
  Administered 2015-03-13 – 2015-03-14 (×2): 25 mg via ORAL
  Filled 2015-03-13 (×2): qty 1

## 2015-03-13 MED ORDER — TRAMADOL HCL 50 MG PO TABS
50.0000 mg | ORAL_TABLET | Freq: Four times a day (QID) | ORAL | Status: DC | PRN
  Administered 2015-03-14: 50 mg via ORAL
  Filled 2015-03-13: qty 1

## 2015-03-13 MED ORDER — FUROSEMIDE 40 MG PO TABS
40.0000 mg | ORAL_TABLET | Freq: Every day | ORAL | Status: DC
  Filled 2015-03-13: qty 1

## 2015-03-13 MED ORDER — ACETAMINOPHEN 325 MG PO TABS: 650.0000 mg | ORAL_TABLET | Freq: Four times a day (QID) | ORAL | Status: DC | PRN

## 2015-03-13 MED ORDER — ACETAMINOPHEN 650 MG RE SUPP: 650.0000 mg | Freq: Four times a day (QID) | RECTAL | Status: DC | PRN

## 2015-03-13 MED ORDER — HYDROXYZINE HCL 25 MG PO TABS
25.0000 mg | ORAL_TABLET | Freq: Three times a day (TID) | ORAL | Status: DC
  Administered 2015-03-13 – 2015-03-14 (×2): 25 mg via ORAL
  Filled 2015-03-13 (×3): qty 1

## 2015-03-13 MED ORDER — METOPROLOL SUCCINATE ER 25 MG PO TB24: 25.0000 mg | ORAL_TABLET | Freq: Every day | ORAL | Status: DC

## 2015-03-13 MED ORDER — PREDNISONE 20 MG PO TABS
10.0000 mg | ORAL_TABLET | Freq: Every day | ORAL | Status: DC
Start: 2015-03-14 — End: 2015-03-13

## 2015-03-13 MED ORDER — DOCUSATE SODIUM 100 MG PO CAPS
100.0000 mg | ORAL_CAPSULE | Freq: Every day | ORAL | Status: DC
  Administered 2015-03-14: 100 mg via ORAL
  Filled 2015-03-13 (×2): qty 1

## 2015-03-13 NOTE — H&P (Signed)
[place] [facility] [place] NAME: [name]    MR#:  [id number]  DATE OF BIRTH:  12-24-1932  DATE OF ADMISSION:  03/13/2015  PRIMARY CARE PHYSICIAN: BABAOFF, Caryl Bis, MD   REQUESTING/REFERRING PHYSICIAN: Dr. Charlaine Dalton  CHIEF COMPLAINT:  No chief complaint on file.   HISTORY OF PRESENT ILLNESS:  [name]  is a 82 y.o. male with a known history of low-grade lymphoma, transfusion-dependent chronic anemia, history of atrial fibrillation status post ablation, hypertension, diastolic CHF and recent admission for vertebral fractures requiring kyphoplasty about 2 weeks ago presents from [facility] rehabilitation for blood transfusion. Patient had kyphoplasty done for his L2-L3 compression fractures about 2 weeks ago, he was discharged to acute rehabilitation. He received 2 units of packed RBC transfusion for hemoglobin of 7 during his last admission. He does have known history of transfusion dependent anemia and his oncologist would want his hemoglobin to be greater than 8. His been complaining of weakness and appeared pale, CBC done today shows a hemoglobin of 7. Since it was late in the day for outpatient transfusion at the outpatient surgical center, he is being admitted under observation for blood transfusion. Patient has been following up with orthopedic surgeon for worsening left knee pain. Follow up recent lumbar spine MRI was also done as an outpatient.  PAST MEDICAL HISTORY:   Past Medical History  Diagnosis Date  . Atrial fibrillation ([place]) 2007    s/p ablation  . Glaucoma   . Hypertension   . BPH (benign prostatic hypertrophy) 1989    s/p TURP  . History of chicken pox   . Microhematuria 1994    longstanding per pt, states normal w/u in past (has had cystoscopy, CT)  . Diastolic CHF ([place]) 01/7508    grade 1  . Diverticulosis of colon (without mention of hemorrhage)   . Colon polyps 06/2012    multiple tubular adenomas,  rec rpt 1 yr Sharlett Iles)  . CLL (chronic lymphocytic leukemia) ([place])   . Chronic anemia   . Pruritus     PAST SURGICAL HISTORY:   Past Surgical History  Procedure Laterality Date  . Hernia repair  x6    inguinal, ventral  . Cataract extraction, bilateral    . Umbilical hernia repair      strangulated  . Radiofrequency ablation  2007    Afib  . Transurethral resection of prostate  1989    BPH  . Colonoscopy  06/2008    no polyps, rec rpt 4-5 yrs given hx polyps (done in [facility])  . US echocardiography  04/2012    EF 55-60%, normal systol fxn, grade 1 diastolic dysfunction, mildly dilated LA, mild pulm HTN  . Carotid US  04/2012    mild plaque bilaterally (0-39%)  . Colonoscopy  06/2012    adenomatous polyp, severe diverticulosis, rec rpt 1 yr Sharlett Iles)  . Esophagogastroduodenoscopy  06/2012    duodenal divertic, o/w WNL Sharlett Iles)  . Cardiac catheterization  2007  . Kyphoplasty N/A 02/25/2015    Procedure: KYPHOPLASTY L2,L3;  Surgeon: Hessie Knows, MD;  Location: [facility] ORS;  Service: Orthopedics;  Laterality: N/A;    SOCIAL HISTORY:   Social History  Substance Use Topics  . Smoking status: Former Smoker -- 1.00 packs/day for 40 years    Types: Cigarettes    Start date: 01/25/1948    Quit date: 01/24/1993  . Smokeless tobacco: Never Used  . Alcohol Use: No    FAMILY HISTORY:  Family History  Problem Relation Age of Onset  . Adopted: Yes  . Cancer Father     lung, smoker  . Stroke Mother   . Diabetes Brother   . CAD Neg Hx     DRUG ALLERGIES:   Allergies  Allergen Reactions  . Rituximab Other (See Comments) and Shortness Of Breath    Passed out, sweating, shaky Passed out, sweating, shakey  . Penicillin G Potassium In D5w Rash  . Penicillins Rash    Tested negative, but reacted negatively    REVIEW OF SYSTEMS:   Review of Systems  Constitutional: Positive for malaise/fatigue. Negative for fever, chills and weight loss.  HENT: Negative for ear  discharge, ear pain, hearing loss and nosebleeds.   Eyes: Negative for blurred vision, double vision and photophobia.  Respiratory: Negative for cough, hemoptysis, shortness of breath and wheezing.   Cardiovascular: Positive for leg swelling. Negative for chest pain, palpitations and orthopnea.  Gastrointestinal: Positive for nausea. Negative for heartburn, vomiting, abdominal pain, diarrhea, constipation and melena.  Genitourinary: Negative for dysuria, urgency and frequency.  Musculoskeletal: Positive for back pain and joint pain. Negative for myalgias and neck pain.       Left knee pain  Skin: Negative for rash.  Neurological: Negative for dizziness, tingling, sensory change, speech change, focal weakness and headaches.  Endo/Heme/Allergies: Does not bruise/bleed easily.  Psychiatric/Behavioral: Negative for depression.    MEDICATIONS AT HOME:   Prior to Admission medications   Medication Sig Start Date End Date Taking? Authorizing Provider  aspirin 81 MG tablet Take 81 mg by mouth daily.    Historical Provider, MD  ciprofloxacin (CIPRO) 500 MG tablet  03/05/15   Historical Provider, MD  cyclobenzaprine (FLEXERIL) 5 MG tablet Take 1 tablet by mouth 3 (three) times daily as needed. 01/29/15   Historical Provider, MD  DiphenhydrAMINE HCl (BENADRYL ALLERGY PO) Take by mouth at bedtime.     Historical Provider, MD  docusate sodium (COLACE) 100 MG capsule Take 100 mg by mouth daily.     Historical Provider, MD  ferrous sulfate 325 (65 FE) MG tablet Take 325 mg by mouth 3 (three) times daily with meals.     Historical Provider, MD  fluconazole (DIFLUCAN) 100 MG tablet  03/05/15   Historical Provider, MD  FLUoxetine (PROZAC) 10 MG capsule Reported on 03/10/2015 12/25/14   Historical Provider, MD  furosemide (LASIX) 40 MG tablet Take 1 tablet (40 mg total) by mouth daily. 02/26/15   Gladstone Lighter, MD  HYDROcodone-acetaminophen (NORCO/VICODIN) 5-325 MG tablet Take 1 tablet by mouth every 4 (four)  hours as needed for moderate pain. 02/26/15   Gladstone Lighter, MD  hydrOXYzine (ATARAX/VISTARIL) 25 MG tablet Take 25 mg by mouth 3 (three) times daily.     Historical Provider, MD  lactulose (CHRONULAC) 10 GM/15ML solution Take 10 g by mouth daily.  12/29/14   Historical Provider, MD  loratadine (CLARITIN) 10 MG tablet Take 10 mg by mouth daily as needed.     Historical Provider, MD  metoprolol succinate (TOPROL-XL) 25 MG 24 hr tablet Reported on 03/10/2015 03/02/15   Historical Provider, MD  metoprolol succinate (TOPROL-XL) 50 MG 24 hr tablet Reported on 03/10/2015 03/09/15   Historical Provider, MD  potassium chloride SA (K-DUR,KLOR-CON) 20 MEQ tablet Take 20 mEq by mouth daily.    Historical Provider, MD  predniSONE (DELTASONE) 10 MG tablet Take 10-20 mg by mouth daily with breakfast. Reported on 03/10/2015    Historical Provider, MD  psyllium (REGULOID) 0.52  G capsule Take 0.52 g by mouth daily. Reported on 03/10/2015    Historical Provider, MD  REVLIMID 5 MG capsule Reported on 03/10/2015 12/15/14   Historical Provider, MD  traMADol Veatrice Bourbon) 50 MG tablet  02/18/15   Historical Provider, MD  triamcinolone cream (KENALOG) 0.5 % Reported on 03/10/2015 12/25/14   Historical Provider, MD      VITAL SIGNS:  Blood pressure 104/51, pulse 128, temperature 98.3 F (36.8 C), temperature source Oral, resp. rate 18, SpO2 98 %.  PHYSICAL EXAMINATION:   Physical Exam  GENERAL:  82 y.o.-year-old patient lying in the bed with no acute distress.  EYES: Pupils equal, round, reactive to light and accommodation. No scleral icterus. Extraocular muscles intact.  HEENT: Head atraumatic, normocephalic. Oropharynx and nasopharynx clear.  NECK:  Supple, no jugular venous distention. No thyroid enlargement, no tenderness.  LUNGS: Normal breath sounds bilaterally, no wheezing, rales,rhonchi or crepitation. No use of accessory muscles of respiration. Decreased bibasilar breath sounds CARDIOVASCULAR: S1, S2 normal. No  rubs,  or gallops. 3/6 systolic murmur is present ABDOMEN: Soft, nontender, nondistended. Bowel sounds present. No organomegaly or mass.  EXTREMITIES: No  cyanosis, or clubbing. 3+ pitting edema on to the knees NEUROLOGIC: Cranial nerves II through XII are intact. Muscle strength 5/5 in all extremities. Sensation intact. Gait not checked. Generalized weakness noted PSYCHIATRIC: The patient is alert and oriented x 3.  SKIN: No obvious rash, lesion, or ulcer.   LABORATORY PANEL:   CBC  Recent Labs Lab 03/13/15 0628  WBC 7.5  HGB 7.0*  HCT 21.3*  PLT 229   ------------------------------------------------------------------------------------------------------------------  Chemistries   Recent Labs Lab 03/08/15 1551 03/12/15 0835  NA 141 142  K 3.8 4.0  CL 101 103  CO2 31 34*  GLUCOSE 146* 85  BUN 21* 22*  CREATININE 0.89 0.68  CALCIUM 8.5* 8.6*  AST 27  --   ALT 31  --   ALKPHOS 144*  --   BILITOT 0.8  --    ------------------------------------------------------------------------------------------------------------------  Cardiac Enzymes No results for input(s): TROPONINI in the last 168 hours. ------------------------------------------------------------------------------------------------------------------  RADIOLOGY:  No results found.  EKG:   Orders placed or performed during the hospital encounter of 02/24/15  . EKG 12-Lead  . EKG 12-Lead  . ED EKG  . ED EKG  . EKG 12-Lead  . EKG 12-Lead    IMPRESSION AND PLAN:   [name]  is a 82 y.o. male with a known history of low-grade lymphoma, transfusion-dependent chronic anemia, history of atrial fibrillation status post ablation, hypertension, diastolic CHF and recent admission for vertebral fractures requiring kyphoplasty about 2 weeks ago presents from [facility] rehabilitation for blood transfusion.  #1 acute on chronic anemia-baseline hemoglobin is around 8. Patient has transfusion dependent anemia. -Last  transfusion was about 2 weeks ago during last admission. -2 more units of packed RBC ordered to keep hemoglobin greater than 8. Irradiated and washed RBCs ordered. -Patient on prednisone for transfusion reaction and itching with the transfusions. -1 time dose of IV Lasix ordered as well. -Hemoglobin is appropriately elevated, he can be discharged tomorrow.  #2 low back pain with right knee pain-recent L2-L3 ration fracture status post kyphoplasty, outpatient MRI 3 days ago and being followed by orthopedic surgeon. -Left knee pain is due to arthritis. -Continue pain medications as needed.  #3 low-grade lymphoma-following up with [facility] oncology. Due for a bone marrow biopsy at this time. -Continue outpatient follow-up. Revlimid was stopped in December 2016  #4 atrial fibrillation-history of ablation.  Continue to monitor rhythm at this time. Not on anticoagulation due to anemia -Rate controlled with metoprolol  #5 DVT prophylaxis-chronic lower extremity swelling. Recent Dopplers done were negative for DVT. -Hold off on anticoagulation due to anemia. -Continue Ted's and SCDs.  -Social worker consult at for discharge back to rehabilitation tomorrow   All the records are reviewed and case discussed with ED provider. Management plans discussed with the patient, family and they are in agreement.  CODE STATUS:  Full code  TOTAL TIME TAKING CARE OF THIS PATIENT: 50 minutes.    Gladstone Lighter M.D on 03/13/2015 at 3:56 PM  Between 7am to 6pm - Pager - [phone]  After 6pm go to [email or web address] - password [email or web address] [facility]  Office  [phone]  CC: Primary care physician; BABAOFF, Caryl Bis, MD

## 2015-03-13 NOTE — Care Management (Addendum)
Patient recently here at [facility] from [facility] under private pay. O2 was delivered to patient through advanced home care last visit. Home health was also arranged through [facility] however patient/wife decided to pay privately for SNF because patient was Medicare Observation. This visit Hgb 7.0 today with tachycardia. Patient admitted in Medicare OBSERVATION this visit for blood transfusion.

## 2015-03-13 NOTE — Clinical Social Work Note (Signed)
Clinical Social Work Assessment  Patient Details  Name: [name] MRN: [id number] Date of Birth: 1932/07/10  Date of referral:  03/13/15               Reason for consult:  Facility Placement, Other (Comment Required) (From [place] (private pay) )                Permission sought to share information with:  Chartered certified accountant granted to share information::  Yes, Verbal Permission Granted  Name::      IT sales professional::   [place]   Relationship::     Contact Information:     Housing/Transportation Living arrangements for the past 2 months:  [place], [place] of Information:  Spouse Patient Interpreter Needed:  None Criminal Activity/Legal Involvement Pertinent to Current Situation/Hospitalization:  No - Comment as needed Significant Relationships:  Adult Children, Spouse Lives with:  Spouse, Facility Resident Do you feel safe going back to the place where you live?  Yes Need for family participation in patient care:  Yes (Comment)  Care giving concerns:  Patient is from [facility].    Social Worker assessment / plan:  Holiday representative (CSW) is familiar with patient and family from last admission. Per Kim admissions coordinator at [facility] patient is still at [facility] under private pay for STR. Per Maudie Mercury patient came to [facility] for a blood transfusion. Maudie Mercury reported that if patient returns Friday or Saturday no FL2 is needed. Per MD patient will probably D/C tomorrow morning because blood transfusion will likely not be completed until late this evening. CSW met with patient's wife outside of the room. Wife reported that she would like for patient to return to [facility] and continue PT and OT. CSW explained to wife that patient is not inpatient this admission and patient will continue to have to pay out of pocket for [facility]. Wife is agreeable to paying out of pocket for [facility].  Wife reported that patient recently had a urology appointment where there was a cancer spot found on patient's foreskin and the MD talked about palliative care. CSW explained palliative care and hospice services. Wife reported that she is interested in palliative care services at [facility]. CSW exlained that wife can ask to get palliative involved when patient returns to [facility].  Per Maudie Mercury patient can return to [place] over the weekend. EMS form on chart. MD and RN aware of above. CSW will continue to follow and assist as needed.     Employment status:  Retired Forensic scientist:  Medicare (Medicare Observation ) PT Recommendations:  Not assessed at this time Information / Referral to community resources:  [place]  Patient/Family's Response to care:  Patient and wife are agreeable for patient to return to [place].   Patient/Family's Understanding of and Emotional Response to Diagnosis, Current Treatment, and Prognosis:  Patient's wife seemed anxious and reported feeling overwhelmed. CSW provided emotional support.   Emotional Assessment Appearance:  Appears stated age Attitude/Demeanor/Rapport:    Affect (typically observed):  Accepting, Pleasant Orientation:  Oriented to Self, Oriented to Place, Oriented to  Time, Oriented to Situation Alcohol / Substance use:  Not Applicable Psych involvement (Current and /or in the community):  No (Comment)  Discharge Needs  Concerns to be addressed:  Discharge Planning Concerns Readmission within the last 30 days:  Yes Current discharge risk:  Chronically ill, Dependent with Mobility Barriers to Discharge:  Continued  Medical Work up   Loralyn Freshwater, LCSW 03/13/2015, 4:07 PM

## 2015-03-13 NOTE — Care Management Note (Signed)
Case Management Note  Patient Details  Name: [name] MRN: [id number] Date of Birth: 11-Jun-1932  Subjective/Objective:     Direct admit from MD office for blood transfusion.               Action/Plan: Outpatient did not start blood transfusion , requires 2 units PRBC, they did not have time to complete the transfusions  / admit under OPIB and plan to discharge back to [facility] after transfusion completed. Initial review not required.  Expected Discharge Date:                  Expected Discharge Plan:     In-House Referral:     Discharge planning Services     Post Acute Care Choice:    Choice offered to:     DME Arranged:    DME Agency:     HH Arranged:    [place] Agency:     Status of Service:     Medicare Important Message Given:    Date Medicare IM Given:    Medicare IM give by:    Date Additional Medicare IM Given:    Additional Medicare Important Message give by:     If discussed at [place] of Stay Meetings, dates discussed:    Additional Comments:  Ival Bible, RN 03/13/2015, 4:04 PM

## 2015-03-14 ENCOUNTER — Ambulatory Visit: Payer: Medicare Other

## 2015-03-14 DIAGNOSIS — D630 Anemia in neoplastic disease: Secondary | ICD-10-CM | POA: Diagnosis not present

## 2015-03-14 LAB — BASIC METABOLIC PANEL
Anion gap: 5 (ref 5–15)
BUN: 21 mg/dL — AB (ref 6–20)
CALCIUM: 8.3 mg/dL — AB (ref 8.9–10.3)
CO2: 39 mmol/L — ABNORMAL HIGH (ref 22–32)
CREATININE: 0.63 mg/dL (ref 0.61–1.24)
Chloride: 99 mmol/L — ABNORMAL LOW (ref 101–111)
GFR calc Af Amer: >60 mL/min (ref >60)
GLUCOSE: 107 mg/dL — AB (ref 65–99)
Potassium: 3.5 mmol/L (ref 3.5–5.1)
SODIUM: 143 mmol/L (ref 135–145)

## 2015-03-14 LAB — HEMOGLOBIN AND HEMATOCRIT, BLOOD
HEMATOCRIT: 27.9 % — AB (ref 40.0–52.0)
Hemoglobin: 9.5 g/dL — ABNORMAL LOW (ref 13.0–18.0)

## 2015-03-14 LAB — CBC
HCT: 24.7 % — ABNORMAL LOW (ref 40.0–52.0)
HEMOGLOBIN: 8.2 g/dL — AB (ref 13.0–18.0)
MCH: 31.3 pg (ref 26.0–34.0)
MCHC: 33.2 g/dL (ref 32.0–36.0)
MCV: 94.1 fL (ref 80.0–100.0)
PLATELETS: 224 10*3/uL (ref 150–440)
RBC: 2.63 MIL/uL — AB (ref 4.40–5.90)
RDW: 20.4 % — ABNORMAL HIGH (ref 11.5–14.5)
WBC: 8.2 10*3/uL (ref 3.8–10.6)

## 2015-03-14 LAB — PREPARE RBC (CROSSMATCH)

## 2015-03-14 MED ORDER — METOPROLOL TARTRATE 25 MG PO TABS: 25.0000 mg | ORAL_TABLET | Freq: Two times a day (BID) | ORAL | Status: AC

## 2015-03-14 MED ORDER — SODIUM CHLORIDE 0.9 % IV SOLN
Freq: Once | INTRAVENOUS | Status: AC
  Administered 2015-03-14: 10:00:00 via INTRAVENOUS

## 2015-03-14 MED ORDER — HYDROCODONE-ACETAMINOPHEN 5-325 MG PO TABS: 1.0000 | ORAL_TABLET | ORAL | Status: AC | PRN

## 2015-03-14 MED ORDER — FUROSEMIDE 10 MG/ML IJ SOLN
40.0000 mg | Freq: Once | INTRAMUSCULAR | Status: AC
  Administered 2015-03-14: 40 mg via INTRAVENOUS
  Filled 2015-03-14: qty 4

## 2015-03-14 MED ORDER — POTASSIUM CHLORIDE CRYS ER 20 MEQ PO TBCR
40.0000 meq | EXTENDED_RELEASE_TABLET | Freq: Once | ORAL | Status: AC
  Administered 2015-03-14: 40 meq via ORAL
  Filled 2015-03-14: qty 2

## 2015-03-14 NOTE — Progress Notes (Signed)
Patient received another unit of blood today which increased his hgb to 9.5.  He is being discharged back to [facility] for PT from his kypho 2 weeks ago. Called report to [place] at [place]. Called EMS. Tech will prepare patient for transportation.  VSS at time of discharge.

## 2015-03-14 NOTE — Progress Notes (Signed)
Patient has orders for d/c to to return to [facility]. Dr Leslye Peer completed and signed d/c summary and order. All d/c information faxed through hub. Patient leaving by EMS. SW completed EMS packet and placed hard scripts in packet. RN Herma Ard (Ext [phone] ) was notified that packet was ready and placed next to patients chart. Nurse was provided Rm # [id number] and point of contact Kim at facility. Contact # for report is [phone]. Patient's wife [name] and daughter was notified of discharge at bedside. No further identified needs.   Anitra Lauth, BSW, MSW, [place] Work Dept [phone]

## 2015-03-14 NOTE — Discharge Summary (Signed)
[facility] NAME: [name]    MR#:  [id number]  DATE OF BIRTH:  April 05, 1932  DATE OF ADMISSION:  03/13/2015 ADMITTING PHYSICIAN: Gladstone Lighter, MD  DATE OF DISCHARGE: 03/14/2015  PRIMARY CARE PHYSICIAN: BABAOFF, Caryl Bis, MD    ADMISSION DIAGNOSIS:  anemia  DISCHARGE DIAGNOSIS:  Active Problems:   Anemia   SECONDARY DIAGNOSIS:   Past Medical History  Diagnosis Date  . Atrial fibrillation ([place]) 2007    s/p ablation  . Glaucoma   . Hypertension   . BPH (benign prostatic hypertrophy) 1989    s/p TURP  . History of chicken pox   . Microhematuria 1994    longstanding per pt, states normal w/u in past (has had cystoscopy, CT)  . Diastolic CHF ([place]) 06/3844    grade 1  . Diverticulosis of colon (without mention of hemorrhage)   . Colon polyps 06/2012    multiple tubular adenomas, rec rpt 1 yr Sharlett Iles)  . CLL (chronic lymphocytic leukemia) ([place])   . Chronic anemia   . Pruritus     HOSPITAL COURSE:   1. Anemia of chronic disease. Patient was sent over for hemoglobin of 7.0 and symptomatic with shortness of breath and cough. Patient received 2 units of packed red blood cells with a poor response of 8.2. A third unit of blood was given this morning secondary to still being symptomatic. And repeat hemoglobin came back at 9.5. Patient will follow up with hematology as outpatient. Recommend checking a hemoglobin on a weekly basis until stable. 2. Shortness of breath with diastolic congestive heart failure. Patient was given Lasix before and after blood on the day of discharge. Continue Lasix 40 mg on a daily basis. Toprol switch over to metoprolol. 3. Atrial fibrillation status post ablation. Hold aspirin with anemia 4. Penile mass follow up with urology as outpatient. Patient placed on Diflucan and Cipro as per urology. The rehabilitation has and dates of those medications. 5. History of CLL follow-up with  oncology as outpatient. Patient's family states that they've been trying to get him to get over to have a bone marrow biopsy. 6. Low back pain with recent kyphoplasty 7. Teds to prevent DVT  DISCHARGE CONDITIONS:   Satisfactory  CONSULTS OBTAINED:  None  DRUG ALLERGIES:   Allergies  Allergen Reactions  . Rituximab Other (See Comments) and Shortness Of Breath    Passed out, sweating, shaky Passed out, sweating, shakey  . Penicillin G Potassium In D5w Rash  . Penicillins Rash    Tested negative, but reacted negatively    DISCHARGE MEDICATIONS:   Current Discharge Medication List    START taking these medications   Details  metoprolol tartrate (LOPRESSOR) 25 MG tablet Take 1 tablet (25 mg total) by mouth 2 (two) times daily. Qty: 60 tablet, Refills: 0      CONTINUE these medications which have CHANGED   Details  HYDROcodone-acetaminophen (NORCO/VICODIN) 5-325 MG tablet Take 1 tablet by mouth every 4 (four) hours as needed for moderate pain. Qty: 20 tablet, Refills: 0      CONTINUE these medications which have NOT CHANGED   Details  ciprofloxacin (CIPRO) 500 MG tablet     cyclobenzaprine (FLEXERIL) 5 MG tablet Take 1 tablet by mouth 3 (three) times daily as needed.    DiphenhydrAMINE HCl (BENADRYL ALLERGY PO) Take by mouth at bedtime.     docusate sodium (COLACE) 100 MG capsule Take 100 mg by mouth daily.  ferrous sulfate 325 (65 FE) MG tablet Take 325 mg by mouth 3 (three) times daily with meals.     fluconazole (DIFLUCAN) 100 MG tablet     FLUoxetine (PROZAC) 10 MG capsule Reported on 03/10/2015    furosemide (LASIX) 40 MG tablet Take 1 tablet (40 mg total) by mouth daily. Qty: 30 tablet, Refills: 0    hydrOXYzine (ATARAX/VISTARIL) 25 MG tablet Take 25 mg by mouth 3 (three) times daily.     lactulose (CHRONULAC) 10 GM/15ML solution Take 10 g by mouth daily.     loratadine (CLARITIN) 10 MG tablet Take 10 mg by mouth daily as needed.     potassium  chloride SA (K-DUR,KLOR-CON) 20 MEQ tablet Take 20 mEq by mouth daily.    predniSONE (DELTASONE) 10 MG tablet Take 10-20 mg by mouth daily with breakfast. Reported on 03/10/2015    psyllium (REGULOID) 0.52 G capsule Take 0.52 g by mouth daily. Reported on 03/10/2015    REVLIMID 5 MG capsule Reported on 03/10/2015    triamcinolone cream (KENALOG) 0.5 % Reported on 03/10/2015      STOP taking these medications     aspirin 81 MG tablet      metoprolol succinate (TOPROL-XL) 25 MG 24 hr tablet      metoprolol succinate (TOPROL-XL) 50 MG 24 hr tablet      traMADol (ULTRAM) 50 MG tablet          DISCHARGE INSTRUCTIONS:    Follow-up with Dr. rehabilitation  If you experience worsening of your admission symptoms, develop shortness of breath, life threatening emergency, suicidal or homicidal thoughts you must seek medical attention immediately by calling 911 or calling your MD immediately  if symptoms less severe.  You Must read complete instructions/literature along with all the possible adverse reactions/side effects for all the Medicines you take and that have been prescribed to you. Take any new Medicines after you have completely understood and accept all the possible adverse reactions/side effects.   Please note  You were cared for by a hospitalist during your hospital stay. If you have any questions about your discharge medications or the care you received while you were in the hospital after you are discharged, you can call the unit and asked to speak with the hospitalist on call if the hospitalist that took care of you is not available. Once you are discharged, your primary care physician will handle any further medical issues. Please note that NO REFILLS for any discharge medications will be authorized once you are discharged, as it is imperative that you return to your primary care physician (or establish a relationship with a primary care physician if you do not have one) for your  aftercare needs so that they can reassess your need for medications and monitor your lab values.    Today   CHIEF COMPLAINT:  No chief complaint on file.   HISTORY OF PRESENT ILLNESS:  [name]  is a 82 y.o. male with a known history of anemia was sent in for low hemoglobin.   VITAL SIGNS:  Blood pressure 100/61, pulse 92, temperature 98.7 F (37.1 C), temperature source Oral, resp. rate 18, height 6' (1.829 m), weight 75.297 kg (166 lb), SpO2 97 %.  I/O:   Intake/Output Summary (Last 24 hours) at 03/14/15 1509 Last data filed at 03/14/15 1301  Gross per 24 hour  Intake   1248 ml  Output   2300 ml  Net  -1052 ml    PHYSICAL EXAMINATION:  GENERAL:  82 y.o.-year-old patient lying in the bed with no acute distress.  EYES: Pupils equal, round, reactive to light and accommodation. No scleral icterus. Extraocular muscles intact.  HEENT: Head atraumatic, normocephalic. Oropharynx and nasopharynx clear.  NECK:  Supple, no jugular venous distention. No thyroid enlargement, no tenderness.  LUNGS: Normal breath sounds bilaterally, no wheezing, rales,rhonchi or crepitation. No use of accessory muscles of respiration.  CARDIOVASCULAR: S1, S2 normal. No murmurs, rubs, or gallops.  ABDOMEN: Soft, non-tender, non-distended. Bowel sounds present. No organomegaly or mass.  EXTREMITIES: 2+ edema, no cyanosis, or clubbing.  NEUROLOGIC: Cranial nerves II through XII are intact. Muscle strength 5/5 in all extremities. Sensation intact. Gait not checked.  PSYCHIATRIC: The patient is alert and oriented x 3.  SKIN: No obvious rash, lesion, or ulcer.   DATA REVIEW:   CBC  Recent Labs Lab 03/14/15 0229 03/14/15 1450  WBC 8.2  --   HGB 8.2* 9.5*  HCT 24.7* 27.9*  PLT 224  --     Chemistries   Recent Labs Lab 03/08/15 1551  03/14/15 0229  NA 141  < > 143  K 3.8  < > 3.5  CL 101  < > 99*  CO2 31  < > 39*  GLUCOSE 146*  < > 107*  BUN 21*  < > 21*  CREATININE 0.89  < > 0.63   CALCIUM 8.5*  < > 8.3*  AST 27  --   --   ALT 31  --   --   ALKPHOS 144*  --   --   BILITOT 0.8  --   --   < > = values in this interval not displayed.    RADIOLOGY:  Dg Chest 1 View  03/14/2015  CLINICAL DATA:  82 year old male with cough.  Recent kyphoplasty. EXAM: CHEST 1 VIEW COMPARISON:  Prior chest x-ray 02/26/2015 FINDINGS: Right IJ approach single-lumen power injectable port catheter. Catheter tip overlies the distal SVC. Cardiomegaly and with left atrial enlargement. Pulmonary vascular congestion with improving interstitial edema. Slightly improved inspiratory volumes. Persistent right greater than left layering pleural effusions and associated bibasilar atelectasis. No acute osseous abnormality. IMPRESSION: 1. Slight interval improvement in CHF. 2. Persistent right larger than left layering pleural effusions and associated bibasilar atelectasis. Electronically Signed   By: Jacqulynn Cadet M.D.   On: 03/14/2015 09:41    Management plans discussed with the patient, family and they are in agreement.  CODE STATUS:     Code Status Orders        Start     Ordered   03/13/15 1601  Full code   Continuous     03/13/15 1600    Code Status History    Date Active Date Inactive Code Status Order ID Comments User Context   02/24/2015  5:47 PM 02/26/2015  8:04 PM Full Code [id number]  Gladstone Lighter, MD Inpatient    Advance Directive Documentation        Most Recent Value   Type of Advance Directive  Healthcare Power of Attorney, Living will   Pre-existing out of facility DNR order (yellow form or pink MOST form)     "MOST" Form in Place?        TOTAL TIME TAKING CARE OF THIS PATIENT: 35 minutes.    Loletha Grayer M.D on 03/14/2015 at 3:09 PM  Between 7am to 6pm - Pager - [phone]  After 6pm go to [email or web address] - [facility]  Office  [phone]  CC: Primary care physician; BABAOFF, Caryl Bis, MD

## 2015-03-15 LAB — TYPE AND SCREEN
ABO/RH(D): O POS
ANTIBODY SCREEN: NEGATIVE
UNIT DIVISION: 0
Unit division: 0
Unit division: 0

## 2015-03-20 ENCOUNTER — Other Ambulatory Visit: Payer: Self-pay | Admitting: *Deleted

## 2015-03-20 ENCOUNTER — Telehealth: Payer: Self-pay | Admitting: *Deleted

## 2015-03-20 DIAGNOSIS — C8330 Diffuse large B-cell lymphoma, unspecified site: Secondary | ICD-10-CM

## 2015-03-20 NOTE — Telephone Encounter (Signed)
Dr. Glean Salen did call and speak with Dr. Rogue Bussing and he is agreeable to meet and take on pt.  He asked me to put in order for labs and see md one day next week. i have entered inbasket request for staff to sch  Next week and call wife and let her know the date and time.  If wife should have any questions she can call me back when she gets message.

## 2015-03-24 DIAGNOSIS — R410 Disorientation, unspecified: Secondary | ICD-10-CM | POA: Diagnosis not present

## 2015-03-24 LAB — COMPREHENSIVE METABOLIC PANEL
ALT: 33 U/L (ref 17–63)
ANION GAP: 7 (ref 5–15)
AST: 25 U/L (ref 15–41)
Albumin: 2.1 g/dL — ABNORMAL LOW (ref 3.5–5.0)
Alkaline Phosphatase: 170 U/L — ABNORMAL HIGH (ref 38–126)
BILIRUBIN TOTAL: 0.7 mg/dL (ref 0.3–1.2)
BUN: 33 mg/dL — AB (ref 6–20)
CO2: 38 mmol/L — ABNORMAL HIGH (ref 22–32)
Calcium: 8.5 mg/dL — ABNORMAL LOW (ref 8.9–10.3)
Chloride: 97 mmol/L — ABNORMAL LOW (ref 101–111)
Creatinine, Ser: 1.02 mg/dL (ref 0.61–1.24)
Glucose, Bld: 96 mg/dL (ref 65–99)
POTASSIUM: 3.8 mmol/L (ref 3.5–5.1)
Sodium: 142 mmol/L (ref 135–145)
TOTAL PROTEIN: 5.8 g/dL — AB (ref 6.5–8.1)

## 2015-03-24 LAB — URINALYSIS COMPLETE WITH MICROSCOPIC (ARMC ONLY)
BILIRUBIN URINE: NEGATIVE
BROAD CASTS UA: 2
Bacteria, UA: NONE SEEN
GLUCOSE, UA: NEGATIVE mg/dL
HGB URINE DIPSTICK: NEGATIVE
Ketones, ur: NEGATIVE mg/dL
Leukocytes, UA: NEGATIVE
Nitrite: NEGATIVE
Protein, ur: NEGATIVE mg/dL
Specific Gravity, Urine: 1.011 (ref 1.005–1.030)
Squamous Epithelial / LPF: NONE SEEN
pH: 7 (ref 5.0–8.0)

## 2015-03-24 LAB — CBC WITH DIFFERENTIAL/PLATELET
BASOS PCT: 1 %
Basophils Absolute: 0.1 10*3/uL (ref 0–0.1)
EOS PCT: 1 %
Eosinophils Absolute: 0.1 10*3/uL (ref 0–0.7)
HEMATOCRIT: 24.1 % — AB (ref 40.0–52.0)
Hemoglobin: 8 g/dL — ABNORMAL LOW (ref 13.0–18.0)
Lymphocytes Relative: 9 %
Lymphs Abs: 1.2 10*3/uL (ref 1.0–3.6)
MCH: 31.4 pg (ref 26.0–34.0)
MCHC: 33.1 g/dL (ref 32.0–36.0)
MCV: 94.9 fL (ref 80.0–100.0)
MONOS PCT: 18 %
Monocytes Absolute: 2.3 10*3/uL — ABNORMAL HIGH (ref 0.2–1.0)
NEUTROS ABS: 9.1 10*3/uL — AB (ref 1.4–6.5)
Neutrophils Relative %: 71 %
Platelets: 217 10*3/uL (ref 150–440)
RBC: 2.54 MIL/uL — ABNORMAL LOW (ref 4.40–5.90)
RDW: 18.5 % — AB (ref 11.5–14.5)
WBC: 12.8 10*3/uL — ABNORMAL HIGH (ref 3.8–10.6)

## 2015-03-25 ENCOUNTER — Encounter
Admission: RE | Admit: 2015-03-25 | Discharge: 2015-03-25 | Disposition: A | Discharge status: Home / Self Care | Payer: Medicare Other | Source: Ambulatory Visit | Attending: Internal Medicine | Admitting: Internal Medicine

## 2015-03-25 ENCOUNTER — Telehealth: Payer: Self-pay | Admitting: *Deleted

## 2015-03-25 NOTE — Telephone Encounter (Signed)
Wife called and sounded like she was going to cry leaving me the message that pt's mental status has changed -he is confused. He had seen a urologist for a spot on his penis and was told that it was cancer but at the time he had so many things going on that was the least of pt's problems.  He fell at nursing home yest. And he is in a lot more pain-states his legs and knees hurt as well as his back from his back surgery.  They wer having a palliative care consult today at nursing home and she still wants him to come over to appt tom. If she can get him there.  I called her back and said that I got her message but Dr. Jacinto Reap  Wanted him to come if he could so he could be evaluated from oncology perspective.  She wants to come also and she will be here tom.

## 2015-03-26 ENCOUNTER — Inpatient Hospital Stay: Payer: Medicare Other | Attending: Internal Medicine | Admitting: Internal Medicine

## 2015-03-26 ENCOUNTER — Inpatient Hospital Stay: Payer: Medicare Other

## 2015-03-26 ENCOUNTER — Other Ambulatory Visit: Payer: Self-pay | Admitting: Internal Medicine

## 2015-03-26 VITALS — BP 85/49 | HR 60 | Temp 97.9°F | Resp 18

## 2015-03-26 DIAGNOSIS — N400 Enlarged prostate without lower urinary tract symptoms: Secondary | ICD-10-CM

## 2015-03-26 DIAGNOSIS — D649 Anemia, unspecified: Secondary | ICD-10-CM

## 2015-03-26 DIAGNOSIS — C8330 Diffuse large B-cell lymphoma, unspecified site: Secondary | ICD-10-CM

## 2015-03-26 DIAGNOSIS — Z9181 History of falling: Secondary | ICD-10-CM | POA: Insufficient documentation

## 2015-03-26 DIAGNOSIS — R0602 Shortness of breath: Secondary | ICD-10-CM | POA: Diagnosis not present

## 2015-03-26 DIAGNOSIS — K5790 Diverticulosis of intestine, part unspecified, without perforation or abscess without bleeding: Secondary | ICD-10-CM | POA: Diagnosis not present

## 2015-03-26 DIAGNOSIS — R443 Hallucinations, unspecified: Secondary | ICD-10-CM | POA: Insufficient documentation

## 2015-03-26 DIAGNOSIS — Z993 Dependence on wheelchair: Secondary | ICD-10-CM | POA: Diagnosis not present

## 2015-03-26 DIAGNOSIS — D539 Nutritional anemia, unspecified: Secondary | ICD-10-CM

## 2015-03-26 DIAGNOSIS — Z9221 Personal history of antineoplastic chemotherapy: Secondary | ICD-10-CM

## 2015-03-26 DIAGNOSIS — M549 Dorsalgia, unspecified: Secondary | ICD-10-CM | POA: Insufficient documentation

## 2015-03-26 DIAGNOSIS — Z79899 Other long term (current) drug therapy: Secondary | ICD-10-CM | POA: Diagnosis not present

## 2015-03-26 DIAGNOSIS — G8929 Other chronic pain: Secondary | ICD-10-CM

## 2015-03-26 DIAGNOSIS — R221 Localized swelling, mass and lump, neck: Secondary | ICD-10-CM | POA: Insufficient documentation

## 2015-03-26 DIAGNOSIS — I10 Essential (primary) hypertension: Secondary | ICD-10-CM | POA: Insufficient documentation

## 2015-03-26 DIAGNOSIS — Z888 Allergy status to other drugs, medicaments and biological substances status: Secondary | ICD-10-CM

## 2015-03-26 DIAGNOSIS — Z7982 Long term (current) use of aspirin: Secondary | ICD-10-CM | POA: Diagnosis not present

## 2015-03-26 DIAGNOSIS — Z87891 Personal history of nicotine dependence: Secondary | ICD-10-CM | POA: Diagnosis not present

## 2015-03-26 LAB — COMPREHENSIVE METABOLIC PANEL
ALK PHOS: 165 U/L — AB (ref 38–126)
ALT: 26 U/L (ref 17–63)
ANION GAP: 4 — AB (ref 5–15)
AST: 19 U/L (ref 15–41)
Albumin: 2.3 g/dL — ABNORMAL LOW (ref 3.5–5.0)
BILIRUBIN TOTAL: 0.8 mg/dL (ref 0.3–1.2)
BUN: 33 mg/dL — ABNORMAL HIGH (ref 6–20)
CO2: 37 mmol/L — ABNORMAL HIGH (ref 22–32)
Calcium: 8.1 mg/dL — ABNORMAL LOW (ref 8.9–10.3)
Chloride: 95 mmol/L — ABNORMAL LOW (ref 101–111)
Creatinine, Ser: 1.02 mg/dL (ref 0.61–1.24)
GFR calc non Af Amer: >60 mL/min (ref >60)
Glucose, Bld: 150 mg/dL — ABNORMAL HIGH (ref 65–99)
POTASSIUM: 3.7 mmol/L (ref 3.5–5.1)
SODIUM: 136 mmol/L (ref 135–145)
Total Protein: 6.4 g/dL — ABNORMAL LOW (ref 6.5–8.1)

## 2015-03-26 LAB — CBC WITH DIFFERENTIAL/PLATELET
Band Neutrophils: 2 %
Basophils Absolute: 0.4 10*3/uL — ABNORMAL HIGH (ref 0–0.1)
Basophils Relative: 3 %
EOS PCT: 3 %
Eosinophils Absolute: 0.4 10*3/uL (ref 0–0.7)
HEMATOCRIT: 25.7 % — AB (ref 40.0–52.0)
HEMOGLOBIN: 8.5 g/dL — AB (ref 13.0–18.0)
LYMPHS ABS: 0.9 10*3/uL — AB (ref 1.0–3.6)
Lymphocytes Relative: 7 %
MCH: 31.5 pg (ref 26.0–34.0)
MCHC: 33.1 g/dL (ref 32.0–36.0)
MCV: 95.1 fL (ref 80.0–100.0)
MONO ABS: 1.5 10*3/uL — AB (ref 0.2–1.0)
Monocytes Relative: 12 %
NEUTROS ABS: 9.4 10*3/uL — AB (ref 1.4–6.5)
NEUTROS PCT: 73 %
Platelets: 247 10*3/uL (ref 150–440)
RBC: 2.71 MIL/uL — AB (ref 4.40–5.90)
RDW: 18.4 % — AB (ref 11.5–14.5)
Smear Review: ADEQUATE
WBC: 12.6 10*3/uL — AB (ref 3.8–10.6)

## 2015-03-26 LAB — LACTATE DEHYDROGENASE: LDH: 84 U/L — AB (ref 98–192)

## 2015-03-26 LAB — URINE CULTURE

## 2015-03-26 NOTE — Progress Notes (Signed)
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 I reviewed the patient's prior charts/pertinent labs/imaging in detail; findings are summarized above.   82 year old male patient with above history of low-grade B-cell lymphoma/paraneoplastic pruritus- status post multiple lines of therapy most recently Revlimid December 2016 [stopped secondary to intolerance] is here for follow-up/to establish care.  Patient previously had seen Dr. Ma Hillock; eventually moved care to [facility] where he had been falling more recently. Given the recent decline in performance status especially after his back surgery; inability to go to [facility] frequently- patient decided to seek care with [place].  Patient has multiple complaints including bilateral neck swelling; chronic shortness of breath especially with exertion. He is mostly wheelchair bound. He has chronic back pain which has been worse radiating to his legs. Poor appetite. Denies any weight loss. Denies any night sweats. Denies any new lumps or bumps. As per his wife he has intermittent  changes including hallucinations.   REVIEW OF SYSTEMS:  A complete 10 point review of system is done which is negative except mentioned above/history of present  illness.   PAST MEDICAL HISTORY :  Past Medical History  Diagnosis Date  . Atrial fibrillation ([place]) 2007    s/p ablation  . Glaucoma   . Hypertension   . BPH (benign prostatic hypertrophy) 1989    s/p TURP  . History of chicken pox   . Microhematuria 1994    longstanding per pt, states normal w/u in past (has had cystoscopy, CT)  . Diastolic CHF ([place]) 08/163    grade 1  . Diverticulosis of colon (without mention of hemorrhage)   . Colon polyps 06/2012    multiple tubular adenomas, rec rpt 1 yr Sharlett Iles)  . CLL (chronic lymphocytic leukemia) ([place])   . Chronic anemia   . Pruritus     PAST SURGICAL HISTORY :   Past Surgical History  Procedure Laterality Date  . Hernia repair  x6    inguinal, ventral  . Cataract extraction, bilateral    . Umbilical hernia repair      strangulated  . Radiofrequency ablation  2007    Afib  . Transurethral resection of prostate  1989    BPH  . Colonoscopy  06/2008    no polyps, rec rpt 4-5 yrs given hx polyps (done in [facility])  . US echocardiography  04/2012    EF 55-60%, normal systol fxn, grade 1 diastolic dysfunction, mildly dilated LA, mild pulm HTN  . Carotid US  04/2012    mild plaque bilaterally (0-39%)  . Colonoscopy  06/2012    adenomatous polyp, severe diverticulosis, rec rpt 1 yr Sharlett Iles)  . Esophagogastroduodenoscopy  06/2012    duodenal divertic, o/w WNL Sharlett Iles)  . Cardiac catheterization  2007  . Kyphoplasty N/A 02/25/2015    Procedure: KYPHOPLASTY L2,L3;  Surgeon: Hessie Knows,  MD;  Location: [facility] ORS;  Service: Orthopedics;  Laterality: N/A;    FAMILY HISTORY :   Family History  Problem Relation Age of Onset  . Adopted: Yes  . Cancer Father     lung, smoker  . Stroke Mother   . Diabetes Brother   . CAD Neg Hx     SOCIAL HISTORY:   Social History  Substance Use Topics  . Smoking status: Former Smoker -- 1.00 packs/day for 40 years    Types: Cigarettes    Start date: 01/25/1948    Quit date: 01/24/1993   . Smokeless tobacco: Never Used  . Alcohol Use: No    ALLERGIES:  is allergic to rituximab; penicillin g potassium in d5w; and penicillins.  MEDICATIONS:  Current Outpatient Prescriptions  Medication Sig Dispense Refill  . acetaminophen (TYLENOL) 325 MG tablet Take 650 mg by mouth every 4 (four) hours as needed.    Marland Kitchen aspirin 81 MG tablet Take 81 mg by mouth daily.    . Cholecalciferol (VITAMIN D) 2000 units tablet Take 4,000 Units by mouth daily.    . diclofenac sodium (VOLTAREN) 1 % GEL Apply 4 g topically 4 (four) times daily.    Marland Kitchen docusate sodium (COLACE) 100 MG capsule Take 100 mg by mouth daily. Reported on 03/26/2015    . ferrous sulfate 325 (65 FE) MG tablet Take 325 mg by mouth 3 (three) times daily with meals.     . gabapentin (NEURONTIN) 300 MG capsule Take 300 mg by mouth at bedtime.    Marland Kitchen HYDROcodone-acetaminophen (NORCO/VICODIN) 5-325 MG tablet Take 1 tablet by mouth every 4 (four) hours as needed for moderate pain. 20 tablet 0  . hydrophilic ointment Apply topically as needed for dry skin.    . hydrOXYzine (ATARAX/VISTARIL) 25 MG tablet Take 25 mg by mouth 3 (three) times daily.     Marland Kitchen lactulose (CHRONULAC) 10 GM/15ML solution Take 10 g by mouth daily.     Marland Kitchen loratadine (CLARITIN) 10 MG tablet Take 10 mg by mouth daily as needed.     . magnesium hydroxide (MILK OF MAGNESIA) 400 MG/5ML suspension Take by mouth every 4 (four) hours as needed for mild constipation.    . metoprolol tartrate (LOPRESSOR) 25 MG tablet Take 1 tablet (25 mg total) by mouth 2 (two) times daily. 60 tablet 0  . polyvinyl alcohol-povidone (HYPOTEARS) 1.4-0.6 % ophthalmic solution Place 2 drops into both eyes 4 (four) times daily as needed.    . potassium chloride SA (K-DUR,KLOR-CON) 20 MEQ tablet Take 20 mEq by mouth daily.    . predniSONE (DELTASONE) 10 MG tablet Take 5 mg by mouth daily with breakfast. Reported on 03/10/2015    . psyllium (METAMUCIL) 58.6 % packet Take 1 packet by mouth daily.    . sodium  chloride (OCEAN) 0.65 % SOLN nasal spray Place 2 sprays into both nostrils as needed for congestion.    . terbinafine (LAMISIL) 1 % cream Apply 1 application topically 2 (two) times daily.    Marland Kitchen torsemide (DEMADEX) 20 MG tablet Take 20 mg by mouth daily.     No current facility-administered medications for this visit.   Facility-Administered Medications Ordered in Other Visits  Medication Dose Route Frequency Provider Last Rate Last Dose  . sodium chloride 0.9 % injection 10 mL  10 mL Intravenous PRN Leia Alf, MD   10 mL at 08/20/14 1120    PHYSICAL EXAMINATION: ECOG PERFORMANCE STATUS: 3 - Symptomatic, >50% confined to bed  BP 85/49  mmHg  Pulse 60  Temp(Src) 97.9 F (36.6 C) (Tympanic)  Resp 18   GENERAL: frail-appearing Caucasian male patient noted distress. On nasal Cannula oxygen. A wheelchair. Accompanied by his wife.   EYES: positive for pallor OROPHARYNX: no thrush or ulceration NECK: supple, no masses felt LUNGS:decreased breath sounds bilaterally. HEART/CVS: regular rate & rhythm and no murmurs; bilateral lower extremity edema 2+ ABDOMEN:abdomen soft, non-tender and normal bowel sounds; positive for  for ventral hernia. Musculoskeletal:no cyanosis of digits and no clubbing  PSYCH: alert & oriented x 3 with fluent speech NEURO: no focal motor/sensory deficits SKIN:  no rashes or significant lesions; multiple ecchymoses  LABORATORY DATA:  I have reviewed the data as listed    Component Value Date/Time   NA 136 03/26/2015 0949   NA 136 02/26/2014 0846   K 3.7 03/26/2015 0949   K 3.6 05/09/2014 1249   K 4.2 11/02/2011   CL 95* 03/26/2015 0949   CL 99 02/26/2014 0846   CO2 37* 03/26/2015 0949   CO2 29 02/26/2014 0846   GLUCOSE 150* 03/26/2015 0949   GLUCOSE 205* 02/26/2014 0846   BUN 33* 03/26/2015 0949   BUN 13 02/26/2014 0846   CREATININE 1.02 03/26/2015 0949   CREATININE 0.81 05/07/2014 1005   CREATININE 0.74 04/24/2012   CALCIUM 8.1* 03/26/2015 0949    CALCIUM 8.5 02/26/2014 0846   PROT 6.4* 03/26/2015 0949   PROT 7.2 02/26/2014 0846   ALBUMIN 2.3* 03/26/2015 0949   ALBUMIN 2.4* 02/26/2014 0846   ALBUMIN 2.9 04/24/2012   AST 19 03/26/2015 0949   AST 10* 02/26/2014 0846   AST 20 04/24/2012   ALT 26 03/26/2015 0949   ALT 14 02/26/2014 0846   ALKPHOS 165* 03/26/2015 0949   ALKPHOS 118* 02/26/2014 0846   ALKPHOS 91 04/24/2012   BILITOT 0.8 03/26/2015 0949   BILITOT 0.5 02/26/2014 0846   BILITOT 0.5 11/02/2011   GFRNONAA >60 03/26/2015 0949   GFRNONAA >60 05/07/2014 1005   GFRNONAA >60 02/26/2014 0846   GFRAA >60 03/26/2015 0949   GFRAA >60 05/07/2014 1005   GFRAA >60 02/26/2014 0846    No results found for: SPEP, UPEP  Lab Results  Component Value Date   WBC 12.6* 03/26/2015   NEUTROABS 9.4* 03/26/2015   HGB 8.5* 03/26/2015   HCT 25.7* 03/26/2015   MCV 95.1 03/26/2015   PLT 247 03/26/2015      Chemistry      Component Value Date/Time   NA 136 03/26/2015 0949   NA 136 02/26/2014 0846   K 3.7 03/26/2015 0949   K 3.6 05/09/2014 1249   K 4.2 11/02/2011   CL 95* 03/26/2015 0949   CL 99 02/26/2014 0846   CO2 37* 03/26/2015 0949   CO2 29 02/26/2014 0846   BUN 33* 03/26/2015 0949   BUN 13 02/26/2014 0846   CREATININE 1.02 03/26/2015 0949   CREATININE 0.81 05/07/2014 1005   CREATININE 0.74 04/24/2012      Component Value Date/Time   CALCIUM 8.1* 03/26/2015 0949   CALCIUM 8.5 02/26/2014 0846   ALKPHOS 165* 03/26/2015 0949   ALKPHOS 118* 02/26/2014 0846   ALKPHOS 91 04/24/2012   AST 19 03/26/2015 0949   AST 10* 02/26/2014 0846   AST 20 04/24/2012   ALT 26 03/26/2015 0949   ALT 14 02/26/2014 0846   BILITOT 0.8 03/26/2015 0949   BILITOT 0.5 02/26/2014 0846   BILITOT 0.5 11/02/2011       ASSESSMENT & PLAN:   # low-grade  B-cell lymphoma/non-Hodgkin's- recent PET scan January 2017-non-bulky axillary/inguinal lymph node. Given the significant anemia- needing blood transfusion; a better assessment of  patient's burden of disease would be a bone marrow biopsy. However given the multiple medical issues/declining performance status- this could  Potentially be difficult to offer at this time. If patient's performance status improves- Gazyva   Vs idealisib is an option.   # Anemia- likely secondary to infiltration of the bone marrow by lymphoma. Hold off bone marrow biopsy-as discussed above. Recommend PRBC transfusion to keep the hemoglobin greater than 8. Check CBC every 2 weeks. Also rule put iron deficiency.   # chronic back pain- status post recent surgery- defer to orthopedics for epidural injections.  # chronic itching- likely paraneoplastic from his underlying lymphoma. Currently improved- recommend continued Atarax; discontinue Benadryl; and a taper prednisone over 2 weeks.   # hallucinations- probably medication induced- discontinue Benadryl  # debility/bilateral swelling/CHF/wheelchair-bound-   # recommend follow-up in 4 weeks/CBC CMP- to reassess his situation.  # 40 minutes face-to-face with the patient discussing the above plan of care; more than 50% of time spent on prognosis/ natural history; counseling and coordination.     Cammie Sickle, MD 03/26/2015 1:48 PM

## 2015-03-26 NOTE — Progress Notes (Signed)
Pt here to start back f/u after ebing at [facility] with Dr. Glean Salen in clinical trial of revlimid and prednisone.  Pt also had back surgery-fusion of discs. He is currently at [facility] place.  Trying to get stronger but pt he has fallen x 2 and they have stopped therapy right now.  He also saw urology and he has place on skin of penis and urology said it was probably cancer but pt and wife felt he had so many things going on that this could wait.  He has a sore on his sacral area and also another spot where his pants and underwear hits him while sitting.  He has not itched since dec.  Dr. Eveline Keto wanted his prednisone cut down and tapered off for pt.  He has good appetite in am but afternoon/evening is not as good. He was in hosp. For 2 units of blood about 1 week ago. His speech sometimes is not clear and pt notices it too and is concerned about it.

## 2015-04-25 DEATH — deceased

## 2015-04-27 ENCOUNTER — Other Ambulatory Visit: Payer: Medicare Other

## 2015-04-27 ENCOUNTER — Ambulatory Visit: Payer: Medicare Other | Admitting: Internal Medicine

## 2015-11-03 IMAGING — NM NUCLEAR MEDICINE CARDIAC MULTIPLE UPTAKE GATED ACQUISITION SCAN
4 series · 24 of 24 positions shown · non-contrast
Comparison: None.

CLINICAL DATA: History of lymphoma. Evaluate cardiac function in
relation to chemotherapy.

EXAM:
NUCLEAR MEDICINE CARDIAC BLOOD POOL IMAGING (MUGA)
TECHNIQUE: Cardiac multi-gated acquisition was performed at rest following
intravenous injection of Lc-EEm labeled red blood cells.
RADIOPHARMACEUTICALS:  20.4 IRiAc-DDm in-vitro labeled red blood
cells.

[Series 1000: lao 45-gated (results) · 3.30mm/px · 6 of 24 frames shown]
[frame 3/24]
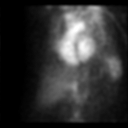
[frame 7/24]
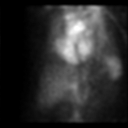
[frame 11/24]
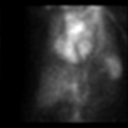
[frame 15/24]
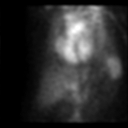
[frame 19/24]
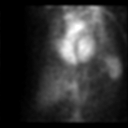
[frame 23/24]
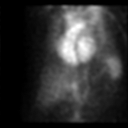

[Series 1000: lao 70-gated · 3.30mm/px · 6 of 24 frames shown]
[frame 3/24]
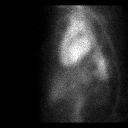
[frame 7/24]
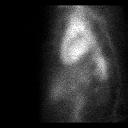
[frame 11/24]
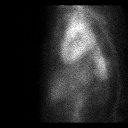
[frame 15/24]
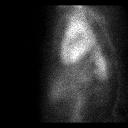
[frame 19/24]
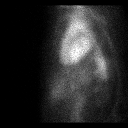
[frame 23/24]
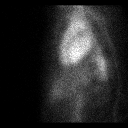

[Series 1000: ant-gated · 3.30mm/px · 6 of 24 frames shown]
[frame 3/24]
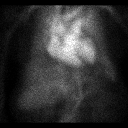
[frame 7/24]
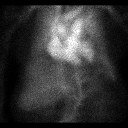
[frame 11/24]
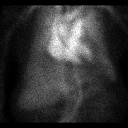
[frame 15/24]
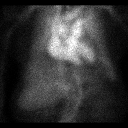
[frame 19/24]
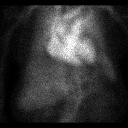
[frame 23/24]
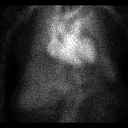

[Series 1000: lao 45-gated · 3.30mm/px · 6 of 24 frames shown]
[frame 3/24]
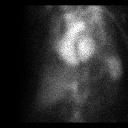
[frame 7/24]
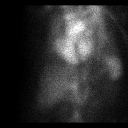
[frame 11/24]
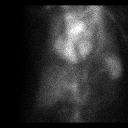
[frame 15/24]
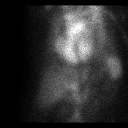
[frame 19/24]
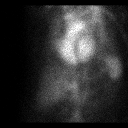
[frame 23/24]
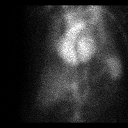

[24 of 24 positions shown; findings below may reference images not displayed]

FINDINGS: No focal wall motion abnormality of the left ventricle.

Calculated left ventricular ejection fraction equals 60 %
IMPRESSION: Calculated left ventricular ejection fraction equals 60 %

## 2016-11-25 IMAGING — DX DG CHEST 1V
1 series · 2 of 2 positions shown · non-contrast
Comparison: Prior chest x-ray 02/26/2015

CLINICAL DATA: 82-year-old male with cough.  Recent kyphoplasty.

EXAM:
CHEST 1 VIEW

[Series 1: chest ap · 0.14mm/px · 2 of 2 slices shown]
[im 1/2]
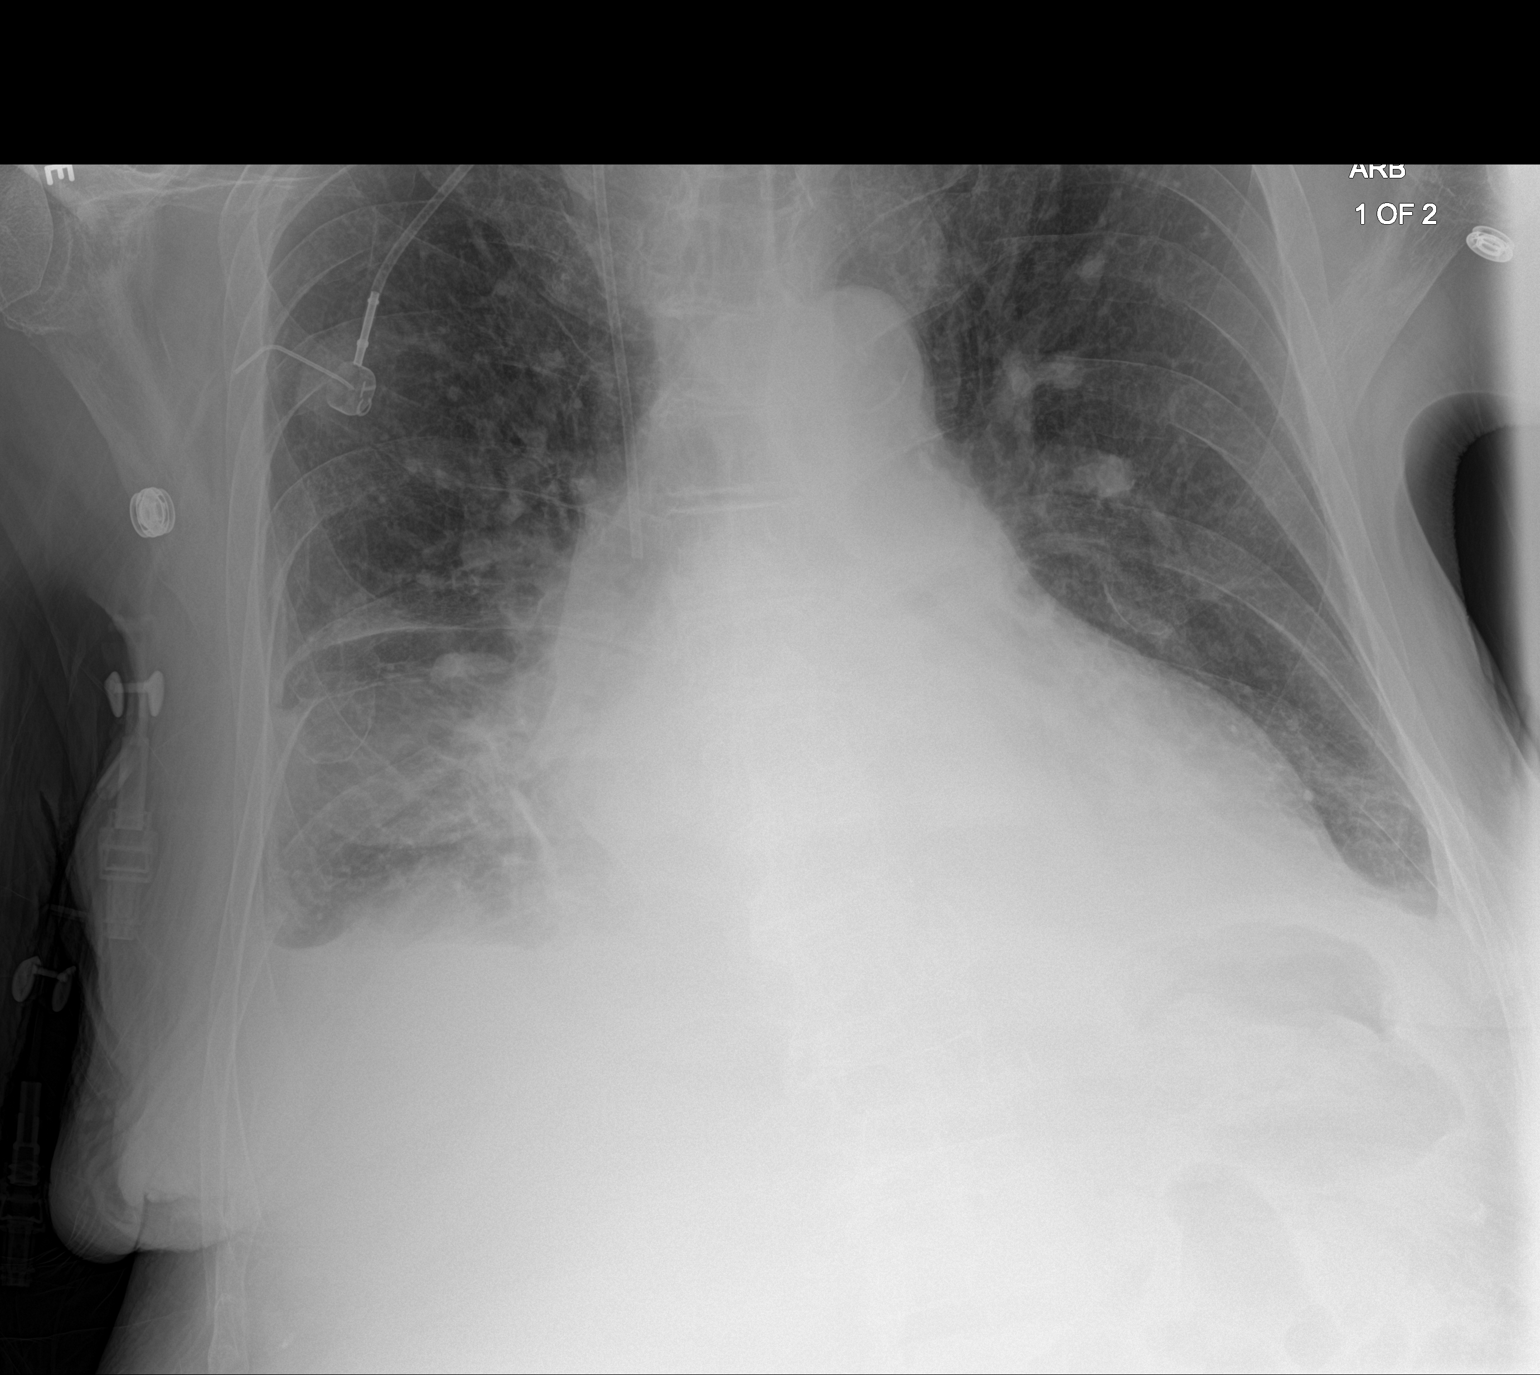
[im 2/2]
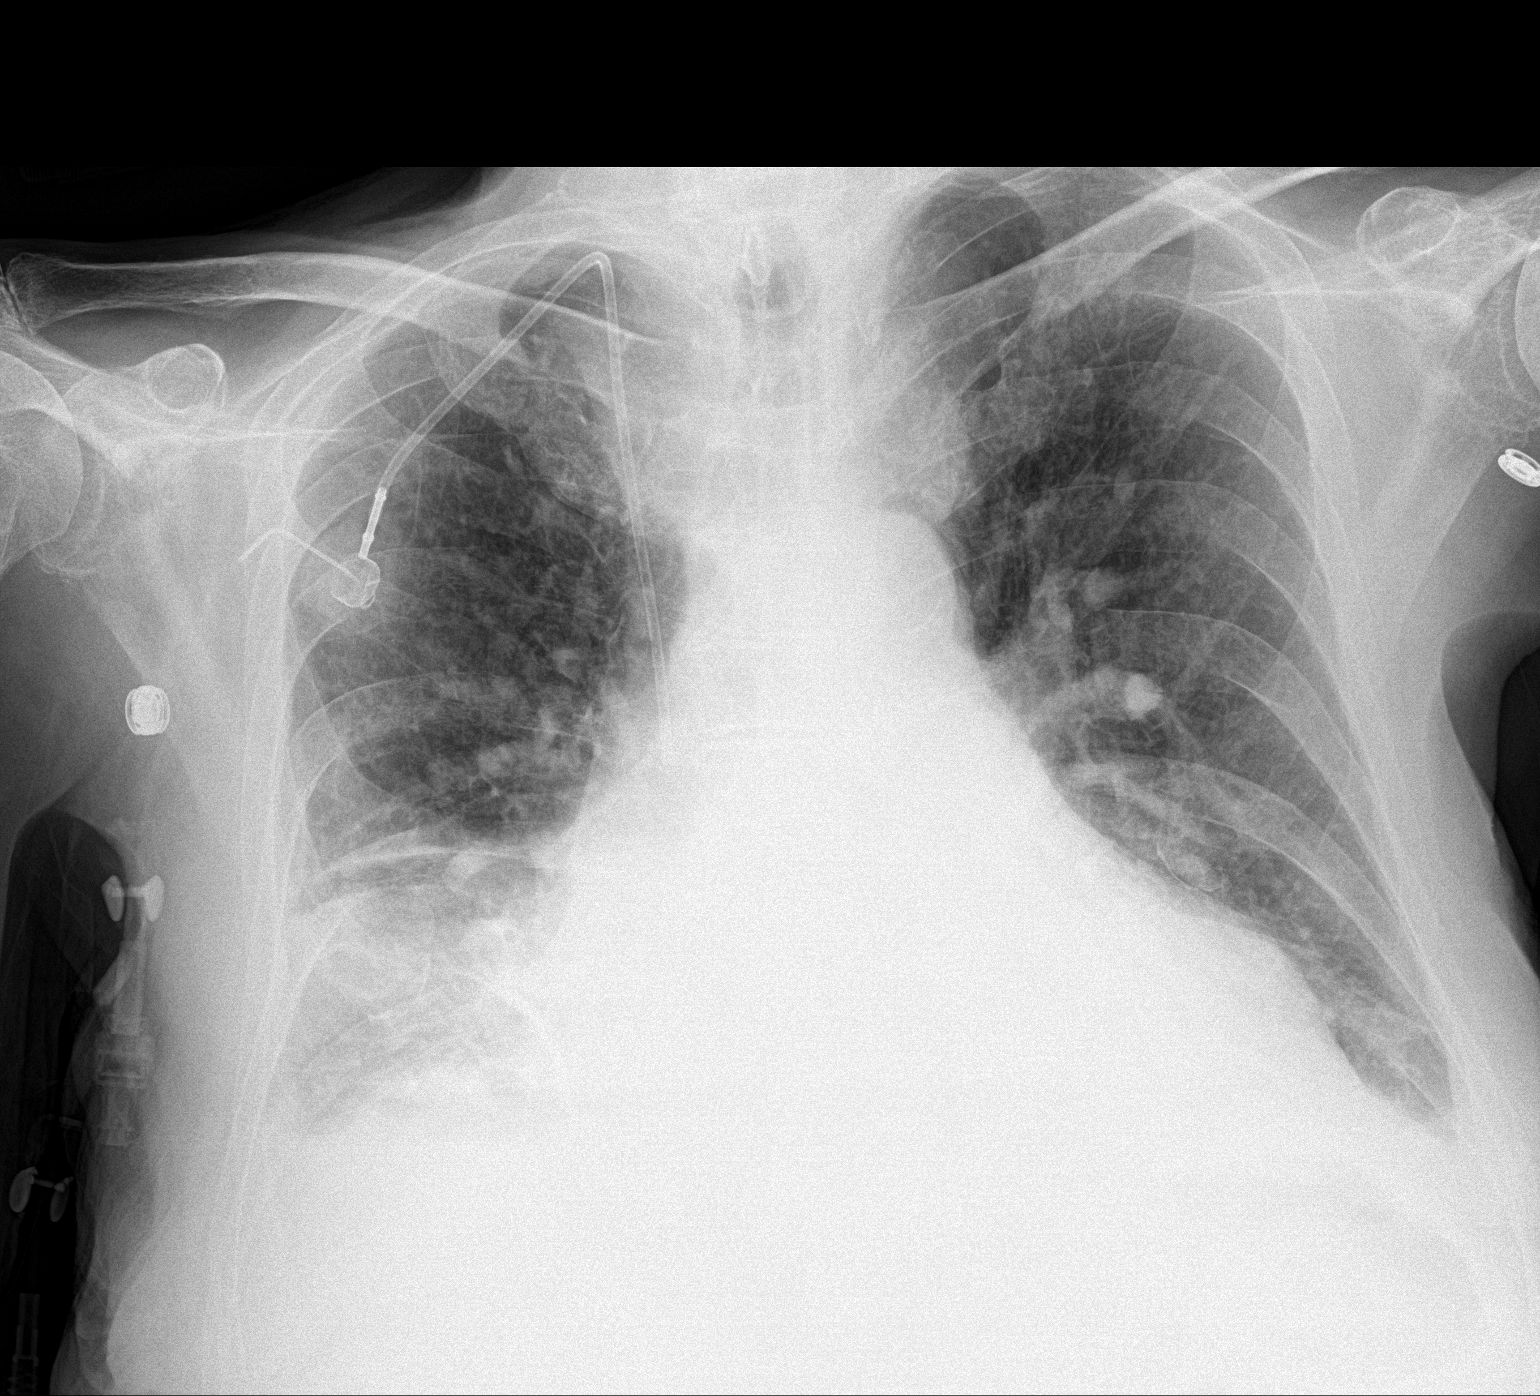

[2 of 2 positions shown; findings below may reference images not displayed]

FINDINGS: Right IJ approach single-lumen power injectable port catheter.
Catheter tip overlies the distal SVC. Cardiomegaly and with left
atrial enlargement. Pulmonary vascular congestion with improving
interstitial edema. Slightly improved inspiratory volumes.
Persistent right greater than left layering pleural effusions and
associated bibasilar atelectasis. No acute osseous abnormality.
IMPRESSION: 1. Slight interval improvement in CHF.
2. Persistent right larger than left layering pleural effusions and
associated bibasilar atelectasis.
